# Patient Record
Sex: Male | Born: 1935 | Race: White | Hispanic: No | Marital: Married | Smoking: Former smoker
Health system: Southern US, Community
[De-identification: ages and names within clinical notes are randomized; demographics above are authoritative.]

## PROBLEM LIST (undated history)

## (undated) DIAGNOSIS — H9190 Unspecified hearing loss, unspecified ear: Secondary | ICD-10-CM

## (undated) DIAGNOSIS — I10 Essential (primary) hypertension: Secondary | ICD-10-CM

## (undated) DIAGNOSIS — I259 Chronic ischemic heart disease, unspecified: Secondary | ICD-10-CM

## (undated) DIAGNOSIS — I340 Nonrheumatic mitral (valve) insufficiency: Secondary | ICD-10-CM

## (undated) DIAGNOSIS — K219 Gastro-esophageal reflux disease without esophagitis: Secondary | ICD-10-CM

## (undated) DIAGNOSIS — E119 Type 2 diabetes mellitus without complications: Secondary | ICD-10-CM

## (undated) DIAGNOSIS — Z87442 Personal history of urinary calculi: Secondary | ICD-10-CM

## (undated) DIAGNOSIS — M1990 Unspecified osteoarthritis, unspecified site: Secondary | ICD-10-CM

## (undated) DIAGNOSIS — R197 Diarrhea, unspecified: Secondary | ICD-10-CM

## (undated) DIAGNOSIS — M109 Gout, unspecified: Secondary | ICD-10-CM

## (undated) HISTORY — DX: Essential (primary) hypertension: I10

## (undated) HISTORY — DX: Chronic ischemic heart disease, unspecified: I25.9

## (undated) HISTORY — DX: Gout, unspecified: M10.9

## (undated) HISTORY — PX: JOINT REPLACEMENT: SHX530

## (undated) HISTORY — PX: COLONOSCOPY: SHX174

## (undated) HISTORY — PX: EYE SURGERY: SHX253

## (undated) HISTORY — DX: Diarrhea, unspecified: R19.7

## (undated) HISTORY — DX: Type 2 diabetes mellitus without complications: E11.9

## (undated) HISTORY — DX: Nonrheumatic mitral (valve) insufficiency: I34.0

---
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
## 1959-05-06 HISTORY — PX: ANKLE SURGERY: SHX546

## 2012-02-26 ENCOUNTER — Encounter (HOSPITAL_COMMUNITY): Payer: Self-pay | Admitting: Pharmacy Technician

## 2012-03-03 NOTE — Patient Instructions (Addendum)
Your procedure is scheduled on: 03/11/2012  Report to [facility] at  0900     AM.  Call this number if you have problems the morning of surgery: [phone]   Do not eat food or drink liquids :After Midnight.      Take these medicines the morning of surgery with A SIP OF WATER:flomax,norvasc,lisinopril,metoprolol. Take flonase before you come. No glipizide morning of surgery.   Do not wear jewelry, make-up or nail polish.  Do not wear lotions, powders, or perfumes. You may wear deodorant.  Do not shave 48 hours prior to surgery.  Do not bring valuables to the hospital.  Contacts, dentures or bridgework may not be worn into surgery.  Leave suitcase in the car. After surgery it may be brought to your room.  For patients admitted to the hospital, checkout time is 11:00 AM the day of discharge.   Patients discharged the day of surgery will not be allowed to drive home.  :     Please read over the following fact sheets that you were given: Coughing and Deep Breathing, Surgical Site Infection Prevention, Anesthesia Post-op Instructions and Care and Recovery After Surgery    Cataract A cataract is a clouding of the lens of the eye. When a lens becomes cloudy, vision is reduced based on the degree and nature of the clouding. Many cataracts reduce vision to some degree. Some cataracts make people more near-sighted as they develop. Other cataracts increase glare. Cataracts that are ignored and become worse can sometimes look white. The white color can be seen through the pupil. CAUSES   Aging. However, cataracts may occur at any age, even in newborns.   Certain drugs.   Trauma to the eye.   Certain diseases such as diabetes.   Specific eye diseases such as chronic inflammation inside the eye or a sudden attack of a rare form of glaucoma.   Inherited or acquired medical problems.  SYMPTOMS   Gradual, progressive drop in vision in the affected eye.   Severe, rapid visual loss. This most  often happens when trauma is the cause.  DIAGNOSIS  To detect a cataract, an eye doctor examines the lens. Cataracts are best diagnosed with an exam of the eyes with the pupils enlarged (dilated) by drops.  TREATMENT  For an early cataract, vision may improve by using different eyeglasses or stronger lighting. If that does not help your vision, surgery is the only effective treatment. A cataract needs to be surgically removed when vision loss interferes with your everyday activities, such as driving, reading, or watching TV. A cataract may also have to be removed if it prevents examination or treatment of another eye problem. Surgery removes the cloudy lens and usually replaces it with a substitute lens (intraocular lens, IOL).  At a time when both you and your doctor agree, the cataract will be surgically removed. If you have cataracts in both eyes, only one is usually removed at a time. This allows the operated eye to heal and be out of danger from any possible problems after surgery (such as infection or poor wound healing). In rare cases, a cataract may be doing damage to your eye. In these cases, your caregiver may advise surgical removal right away. The vast majority of people who have cataract surgery have better vision afterward. HOME CARE INSTRUCTIONS  If you are not planning surgery, you may be asked to do the following:  Use different eyeglasses.   Use stronger or brighter lighting.  Ask your eye doctor about reducing your medicine dose or changing medicines if it is thought that a medicine caused your cataract. Changing medicines does not make the cataract go away on its own.   Become familiar with your surroundings. Poor vision can lead to injury. Avoid bumping into things on the affected side. You are at a higher risk for tripping or falling.   Exercise extreme care when driving or operating machinery.   Wear sunglasses if you are sensitive to bright light or experiencing problems  with glare.  SEEK IMMEDIATE MEDICAL CARE IF:   You have a worsening or sudden vision loss.   You notice redness, swelling, or increasing pain in the eye.   You have a fever.  Document Released: 04/21/2005 Document Revised: 04/10/2011 Document Reviewed: 12/13/2010 [facility] Patient Information 2012 [place].PATIENT INSTRUCTIONS POST-ANESTHESIA  IMMEDIATELY FOLLOWING SURGERY:  Do not drive or operate machinery for the first twenty four hours after surgery.  Do not make any important decisions for twenty four hours after surgery or while taking narcotic pain medications or sedatives.  If you develop intractable nausea and vomiting or a severe headache please notify your doctor immediately.  FOLLOW-UP:  Please make an appointment with your surgeon as instructed. You do not need to follow up with anesthesia unless specifically instructed to do so.  WOUND CARE INSTRUCTIONS (if applicable):  Keep a dry clean dressing on the anesthesia/puncture wound site if there is drainage.  Once the wound has quit draining you may leave it open to air.  Generally you should leave the bandage intact for twenty four hours unless there is drainage.  If the epidural site drains for more than 36-48 hours please call the anesthesia department.  QUESTIONS?:  Please feel free to call your physician or the hospital operator if you have any questions, and they will be happy to assist you.

## 2012-03-04 ENCOUNTER — Encounter (HOSPITAL_COMMUNITY): Payer: Self-pay

## 2012-03-04 ENCOUNTER — Other Ambulatory Visit: Payer: Self-pay

## 2012-03-04 ENCOUNTER — Encounter (HOSPITAL_COMMUNITY)
Admission: RE | Admit: 2012-03-04 | Discharge: 2012-03-04 | Payer: Medicare Other | Source: Ambulatory Visit | Attending: Ophthalmology | Admitting: Ophthalmology

## 2012-03-04 HISTORY — DX: Essential (primary) hypertension: I10

## 2012-03-04 LAB — BASIC METABOLIC PANEL
Chloride: 100 mEq/L (ref 96–112)
Creatinine, Ser: 0.54 mg/dL (ref 0.50–1.35)
GFR calc Af Amer: >90 mL/min (ref >90)
GFR calc non Af Amer: >90 mL/min (ref >90)
Potassium: 4.4 mEq/L (ref 3.5–5.1)

## 2012-03-04 LAB — HEMOGLOBIN AND HEMATOCRIT, BLOOD: Hemoglobin: 15.7 g/dL (ref 13.0–17.0)

## 2012-03-10 MED ORDER — CYCLOPENTOLATE-PHENYLEPHRINE 0.2-1 % OP SOLN
OPHTHALMIC | Status: AC
  Filled 2012-03-10: qty 2

## 2012-03-10 MED ORDER — LIDOCAINE HCL (PF) 1 % IJ SOLN
INTRAMUSCULAR | Status: AC
  Filled 2012-03-10: qty 2

## 2012-03-10 MED ORDER — NEOMYCIN-POLYMYXIN-DEXAMETH 3.5-10000-0.1 OP OINT
TOPICAL_OINTMENT | OPHTHALMIC | Status: AC
  Filled 2012-03-10: qty 3.5

## 2012-03-10 MED ORDER — PHENYLEPHRINE HCL 2.5 % OP SOLN
OPHTHALMIC | Status: AC
  Filled 2012-03-10: qty 2

## 2012-03-10 MED ORDER — LIDOCAINE HCL 3.5 % OP GEL
OPHTHALMIC | Status: AC
  Filled 2012-03-10: qty 5

## 2012-03-10 MED ORDER — TETRACAINE HCL 0.5 % OP SOLN
OPHTHALMIC | Status: AC
  Filled 2012-03-10: qty 2

## 2012-03-11 ENCOUNTER — Encounter (HOSPITAL_COMMUNITY)
Admission: RE | Disposition: A | Discharge status: Home / Self Care | Payer: Self-pay | Source: Ambulatory Visit | Attending: Ophthalmology

## 2012-03-11 ENCOUNTER — Encounter (HOSPITAL_COMMUNITY): Payer: Self-pay | Admitting: *Deleted

## 2012-03-11 ENCOUNTER — Encounter (HOSPITAL_COMMUNITY): Payer: Self-pay | Admitting: Anesthesiology

## 2012-03-11 ENCOUNTER — Ambulatory Visit (HOSPITAL_COMMUNITY)
Admission: RE | Admit: 2012-03-11 | Discharge: 2012-03-11 | Disposition: A | Discharge status: Home / Self Care | Payer: Medicare Other | Source: Ambulatory Visit | Attending: Ophthalmology | Admitting: Ophthalmology

## 2012-03-11 ENCOUNTER — Ambulatory Visit (HOSPITAL_COMMUNITY): Payer: Medicare Other | Admitting: Anesthesiology

## 2012-03-11 DIAGNOSIS — Z01812 Encounter for preprocedural laboratory examination: Secondary | ICD-10-CM | POA: Insufficient documentation

## 2012-03-11 DIAGNOSIS — E119 Type 2 diabetes mellitus without complications: Secondary | ICD-10-CM | POA: Insufficient documentation

## 2012-03-11 DIAGNOSIS — Z01810 Encounter for preprocedural cardiovascular examination: Secondary | ICD-10-CM | POA: Insufficient documentation

## 2012-03-11 DIAGNOSIS — I10 Essential (primary) hypertension: Secondary | ICD-10-CM | POA: Insufficient documentation

## 2012-03-11 DIAGNOSIS — H2510 Age-related nuclear cataract, unspecified eye: Secondary | ICD-10-CM | POA: Insufficient documentation

## 2012-03-11 HISTORY — PX: CATARACT EXTRACTION W/PHACO: SHX586

## 2012-03-11 SURGERY — PHACOEMULSIFICATION, CATARACT, WITH IOL INSERTION
Anesthesia: Monitor Anesthesia Care | Site: Eye | Laterality: Left | Wound class: Clean

## 2012-03-11 MED ORDER — TETRACAINE HCL 0.5 % OP SOLN
1.0000 [drp] | OPHTHALMIC | Status: AC
  Administered 2012-03-11 (×3): 1 [drp] via OPHTHALMIC

## 2012-03-11 MED ORDER — EPINEPHRINE HCL 1 MG/ML IJ SOLN
INTRAOCULAR | Status: DC | PRN
  Administered 2012-03-11: 10:00:00

## 2012-03-11 MED ORDER — MIDAZOLAM HCL 2 MG/2ML IJ SOLN
1.0000 mg | INTRAMUSCULAR | Status: DC | PRN
  Administered 2012-03-11: 2 mg via INTRAVENOUS

## 2012-03-11 MED ORDER — EPINEPHRINE HCL 1 MG/ML IJ SOLN
INTRAMUSCULAR | Status: AC
  Filled 2012-03-11: qty 1

## 2012-03-11 MED ORDER — LACTATED RINGERS IV SOLN
INTRAVENOUS | Status: DC
  Administered 2012-03-11: 1000 mL via INTRAVENOUS

## 2012-03-11 MED ORDER — CYCLOPENTOLATE-PHENYLEPHRINE 0.2-1 % OP SOLN
1.0000 [drp] | OPHTHALMIC | Status: AC
  Administered 2012-03-11 (×3): 1 [drp] via OPHTHALMIC

## 2012-03-11 MED ORDER — FENTANYL CITRATE 0.05 MG/ML IJ SOLN: 25.0000 ug | INTRAMUSCULAR | Status: DC | PRN

## 2012-03-11 MED ORDER — PHENYLEPHRINE HCL 2.5 % OP SOLN
1.0000 [drp] | OPHTHALMIC | Status: AC
  Administered 2012-03-11 (×3): 1 [drp] via OPHTHALMIC

## 2012-03-11 MED ORDER — BSS IO SOLN
INTRAOCULAR | Status: DC | PRN
  Administered 2012-03-11: 15 mL via INTRAOCULAR

## 2012-03-11 MED ORDER — LIDOCAINE HCL 3.5 % OP GEL
1.0000 "application " | Freq: Once | OPHTHALMIC | Status: AC
  Administered 2012-03-11: 1 via OPHTHALMIC

## 2012-03-11 MED ORDER — NEOMYCIN-POLYMYXIN-DEXAMETH 0.1 % OP OINT
TOPICAL_OINTMENT | OPHTHALMIC | Status: DC | PRN
  Administered 2012-03-11: 1 via OPHTHALMIC

## 2012-03-11 MED ORDER — LIDOCAINE HCL (PF) 1 % IJ SOLN
INTRAOCULAR | Status: DC | PRN
  Administered 2012-03-11: 10:00:00 via OPHTHALMIC

## 2012-03-11 MED ORDER — MIDAZOLAM HCL 2 MG/2ML IJ SOLN
INTRAMUSCULAR | Status: AC
  Filled 2012-03-11: qty 2

## 2012-03-11 MED ORDER — POVIDONE-IODINE 5 % OP SOLN
OPHTHALMIC | Status: DC | PRN
  Administered 2012-03-11: 1 via OPHTHALMIC

## 2012-03-11 MED ORDER — LIDOCAINE HCL (PF) 1 % IJ SOLN: INTRAMUSCULAR | Status: DC | PRN

## 2012-03-11 MED ORDER — LACTATED RINGERS IV SOLN
INTRAVENOUS | Status: DC | PRN
  Administered 2012-03-11: 10:00:00 via INTRAVENOUS
  Administered 2012-03-11: 1000 mL

## 2012-03-11 MED ORDER — PROVISC 10 MG/ML IO SOLN
INTRAOCULAR | Status: DC | PRN
  Administered 2012-03-11: 8.5 mg via INTRAOCULAR

## 2012-03-11 MED ORDER — ONDANSETRON HCL 4 MG/2ML IJ SOLN: 4.0000 mg | Freq: Once | INTRAMUSCULAR | Status: DC | PRN

## 2012-03-11 SURGICAL SUPPLY — 33 items
CAPSULAR TENSION RING-AMO (OPHTHALMIC RELATED) IMPLANT
CLOTH BEACON ORANGE TIMEOUT ST (SAFETY) ×2 IMPLANT
EYE SHIELD UNIVERSAL CLEAR (GAUZE/BANDAGES/DRESSINGS) ×2 IMPLANT
GLOVE BIO SURGEON STRL SZ 6.5 (GLOVE) IMPLANT
GLOVE BIOGEL PI IND STRL 6.5 (GLOVE) IMPLANT
GLOVE BIOGEL PI IND STRL 7.0 (GLOVE) ×1 IMPLANT
GLOVE BIOGEL PI IND STRL 7.5 (GLOVE) IMPLANT
GLOVE BIOGEL PI INDICATOR 6.5 (GLOVE)
GLOVE BIOGEL PI INDICATOR 7.0 (GLOVE) ×1
GLOVE BIOGEL PI INDICATOR 7.5 (GLOVE)
GLOVE ECLIPSE 6.5 STRL STRAW (GLOVE) IMPLANT
GLOVE ECLIPSE 7.0 STRL STRAW (GLOVE) IMPLANT
GLOVE ECLIPSE 7.5 STRL STRAW (GLOVE) IMPLANT
GLOVE EXAM NITRILE LRG STRL (GLOVE) IMPLANT
GLOVE EXAM NITRILE MD LF STRL (GLOVE) ×2 IMPLANT
GLOVE SKINSENSE NS SZ6.5 (GLOVE)
GLOVE SKINSENSE NS SZ7.0 (GLOVE)
GLOVE SKINSENSE STRL SZ6.5 (GLOVE) IMPLANT
GLOVE SKINSENSE STRL SZ7.0 (GLOVE) IMPLANT
KIT VITRECTOMY (OPHTHALMIC RELATED) IMPLANT
NEEDLE HYPO 18GX1.5 BLUNT FILL (NEEDLE) ×2 IMPLANT
PAD ARMBOARD 7.5X6 YLW CONV (MISCELLANEOUS) ×2 IMPLANT
PROC W NO LENS (INTRAOCULAR LENS)
PROC W SPEC LENS (INTRAOCULAR LENS)
PROCESS W NO LENS (INTRAOCULAR LENS) IMPLANT
PROCESS W SPEC LENS (INTRAOCULAR LENS) IMPLANT
RING MALYGIN (MISCELLANEOUS) IMPLANT
SIGHTPATH CAT PROC W REG LENS (Ophthalmic Related) ×2 IMPLANT
SYR TB 1ML LL NO SAFETY (SYRINGE) ×2 IMPLANT
TAPE SURG TRANSPORE 1 IN (GAUZE/BANDAGES/DRESSINGS) ×1 IMPLANT
TAPE SURGICAL TRANSPORE 1 IN (GAUZE/BANDAGES/DRESSINGS) ×1
VISCOELASTIC ADDITIONAL (OPHTHALMIC RELATED) IMPLANT
WATER STERILE IRR 250ML POUR (IV SOLUTION) ×2 IMPLANT

## 2012-03-11 NOTE — Anesthesia Postprocedure Evaluation (Signed)
  Anesthesia Post-op Note  Patient: Wesley Garrison  Procedure(s) Performed: Procedure(s) (LRB) with comments: CATARACT EXTRACTION PHACO AND INTRAOCULAR LENS PLACEMENT (IOC) (Left) - CDE 11.09  Patient Location: PACU  Anesthesia Type: MAC  Level of Consciousness: awake, alert , oriented and patient cooperative  Airway and Oxygen Therapy: Patient Spontanous Breathing room air  Post-op Pain: mild  Post-op Assessment: Post-op Vital signs reviewed, Patient's Cardiovascular Status Stable, Respiratory Function Stable, Patent Airway and No signs of Nausea or vomiting  Post-op Vital Signs: Reviewed and stable  Complications: No apparent anesthesia complications  

## 2012-03-11 NOTE — H&P (Signed)
I have reviewed the H&P, the patient was re-examined, and I have identified no interval changes in medical condition and plan of care since the history and physical of record  

## 2012-03-11 NOTE — Brief Op Note (Signed)
Pre-Op Dx: Cataract OS Post-Op Dx: Cataract OS Surgeon: Deija Buhrman Anesthesia: Topical with MAC Surgery: Cataract Extraction with Intraocular lens Implant OS Implant: B&L enVista Specimen: None Complications: None 

## 2012-03-11 NOTE — Anesthesia Preprocedure Evaluation (Signed)
Anesthesia Evaluation   Patient awake    Reviewed: Allergy & Precautions, H&P , NPO status , Patient's Chart, lab work & pertinent test results  Airway Mallampati: II TM Distance: >3 FB     Dental  (+) Edentulous Upper and Edentulous Lower   Pulmonary former smoker,  breath sounds clear to auscultation        Cardiovascular hypertension, Pt. on medications Rhythm:Regular Rate:Normal     Neuro/Psych    GI/Hepatic negative GI ROS,   Endo/Other  diabetes, Well Controlled, Type 2, Oral Hypoglycemic Agents  Renal/GU Renal disease (stones)     Musculoskeletal   Abdominal   Peds  Hematology   Anesthesia Other Findings   Reproductive/Obstetrics                           Anesthesia Physical Anesthesia Plan  ASA: III  Anesthesia Plan: MAC   Post-op Pain Management:    Induction: Intravenous  Airway Management Planned: Nasal Cannula  Additional Equipment:   Intra-op Plan:   Post-operative Plan:   Informed Consent: I have reviewed the patients History and Physical, chart, labs and discussed the procedure including the risks, benefits and alternatives for the proposed anesthesia with the patient or authorized representative who has indicated his/her understanding and acceptance.     Plan Discussed with:   Anesthesia Plan Comments:         Anesthesia Quick Evaluation

## 2012-03-11 NOTE — Preoperative (Signed)
Beta Blockers   Reason not to administer Beta Blockers:Not Applicable 

## 2012-03-11 NOTE — Transfer of Care (Signed)
  Anesthesia Post-op Note  Patient: Wesley Garrison  Procedure(s) Performed: Procedure(s) (LRB) with comments: CATARACT EXTRACTION PHACO AND INTRAOCULAR LENS PLACEMENT (IOC) (Left) - CDE 11.09  Patient Location: PACU  Anesthesia Type: MAC  Level of Consciousness: awake, alert , oriented and patient cooperative  Airway and Oxygen Therapy: Patient Spontanous Breathing room air  Post-op Pain: mild  Post-op Assessment: Post-op Vital signs reviewed, Patient's Cardiovascular Status Stable, Respiratory Function Stable, Patent Airway and No signs of Nausea or vomiting  Post-op Vital Signs: Reviewed and stable  Complications: No apparent anesthesia complications

## 2012-03-11 NOTE — Anesthesia Procedure Notes (Signed)
Procedure Name: MAC Date/Time: 03/11/2012 10:12 AM Performed by: Carolyne Littles, AMY L Pre-anesthesia Checklist: Patient identified, Patient being monitored, Emergency Drugs available, Timeout performed and Suction available Oxygen Delivery Method: Nasal cannula

## 2012-03-12 NOTE — Op Note (Signed)
NAMEKEYVIN, RISON              ACCOUNT NO.:  [id number]  MEDICAL RECORD NO.:  [id number]  LOCATION:  [facility]                          FACILITY:  [facility]  PHYSICIAN:  Susanne Greenhouse, MD       DATE OF BIRTH:  1925-02-21  DATE OF PROCEDURE:  03/11/2012 DATE OF DISCHARGE:  03/11/2012                              OPERATIVE REPORT   PREOPERATIVE DIAGNOSIS:  Nuclear cataract, left eye, diagnosis code 366.16.  POSTOPERATIVE DIAGNOSIS:  Nuclear cataract, left eye, diagnosis code 366.16.  SURGEON:  Susanne Greenhouse, MD  OPERATION PERFORMED:  Phacoemulsification with posterior chamber intraocular lens implantation, left eye.  ANESTHESIA:  Topical with IV sedation.  OPERATIVE SUMMARY:  In the preoperative area, dilating drops were placed into the left eye.  The patient was then brought into the operating room where he was placed under topical anesthesia and IV sedation.  The eye was then prepped and draped.  Beginning with a 75 blade, a paracentesis port was made at the surgeon's 2 o'clock position.  The anterior chamber was then filled with a 1% nonpreserved lidocaine solution with epinephrine.  This was followed by Viscoat to deepen the chamber.  A small fornix-based peritomy was performed superiorly.  Next, a single iris hook was placed through the limbus superiorly.  A 2.4-mm keratome blade was then used to make a clear corneal incision over the iris hook. A bent cystotome needle and Utrata forceps were used to create a continuous tear capsulotomy.  Hydrodissection was performed using balanced salt solution on a fine cannula.  The lens nucleus was then removed using phacoemulsification in a quadrant cracking technique.  The cortical material was then removed with irrigation and aspiration.  The capsular bag and anterior chamber were refilled with Provisc.  The wound was widened to approximately 3 mm and a posterior chamber intraocular lens was placed into the capsular bag without difficulty  using an [employer] lens injecting system.  A single 10-0 nylon suture was then used to close the incision as well as stromal hydration.  The Provisc was removed from the anterior chamber and capsular bag with irrigation and aspiration.  At this point, the wounds were tested for leak, which were negative.  The anterior chamber remained deep and stable.  The patient tolerated the procedure well.  There were no operative complications, and he awoke from topical anesthesia and IV sedation without problem.  No surgical specimens.  Prosthetic device used is a Designer, industrial/product lens, model MX60, power of 22.0, serial number is [id number].          ______________________________ Susanne Greenhouse, MD     KEH/MEDQ  D:  03/11/2012  T:  03/12/2012  Job:  [id number]

## 2012-03-15 ENCOUNTER — Encounter (HOSPITAL_COMMUNITY): Payer: Self-pay | Admitting: Ophthalmology

## 2012-03-16 LAB — GLUCOSE, CAPILLARY: Glucose-Capillary: 136 mg/dL — ABNORMAL HIGH (ref 70–99)

## 2012-07-06 ENCOUNTER — Encounter: Payer: Self-pay | Admitting: Cardiology

## 2012-07-15 ENCOUNTER — Other Ambulatory Visit: Payer: Self-pay | Admitting: *Deleted

## 2012-07-15 ENCOUNTER — Encounter: Payer: Self-pay | Admitting: Cardiovascular Disease

## 2012-07-15 ENCOUNTER — Encounter: Payer: Self-pay | Admitting: *Deleted

## 2012-07-15 ENCOUNTER — Ambulatory Visit (INDEPENDENT_AMBULATORY_CARE_PROVIDER_SITE_OTHER): Payer: Medicare Other | Admitting: Cardiovascular Disease

## 2012-07-15 VITALS — BP 129/64 | HR 55 | Ht 69.0 in | Wt 183.0 lb

## 2012-07-15 DIAGNOSIS — R0609 Other forms of dyspnea: Secondary | ICD-10-CM

## 2012-07-15 DIAGNOSIS — I2510 Atherosclerotic heart disease of native coronary artery without angina pectoris: Secondary | ICD-10-CM | POA: Insufficient documentation

## 2012-07-15 DIAGNOSIS — R0600 Dyspnea, unspecified: Secondary | ICD-10-CM | POA: Insufficient documentation

## 2012-07-15 NOTE — Assessment & Plan Note (Signed)
This could be angina equivalent. Given the patient's prolonged history of diabetes and evidence of coronary calcifications on CT scan, we have to evaluate him for possible obstructive coronary artery disease. Thus, I recommend further evaluation with a pharmacologic nuclear stress test. The patient is not able to exercise on a treadmill due to severe back pain and bilateral knee replacement.

## 2012-07-15 NOTE — Patient Instructions (Addendum)
Your physician has requested that you have a lexiscan myoview. For further information please visit [email or web address]. Please follow instruction sheet, as given.  Follow up as needed 

## 2012-07-15 NOTE — Progress Notes (Signed)
HPI  This is a pleasant 79 year old male who was referred by Dr. Sherril Croon for evaluation of an abnormal echocardiogram and CT of the chest which showed evidence of coronary calcifications. The patient is not aware of any previous cardiac history. He has prolonged history of type 2 diabetes and hypertension. His functional capacity is extremely limited due to previous knee arthritis and significant back discomfort. He had a recent echocardiogram done for dyspnea which showed normal LV systolic function, moderate mitral regurgitation, mild aortic regurgitation and mild pulmonary hypertension. He had CT of the chest done due to cough which showed atherosclerosis and calcifications of the RCA and LAD. He denies any chest discomfort. He does complain of significant exertional fatigue and dyspnea. No orthopnea, PND or syncope. He has chronic lower extremity edema.  No Known Allergies   Current Outpatient Prescriptions on File Prior to Visit  Medication Sig Dispense Refill  . amLODipine (NORVASC) 5 MG tablet Take 1 tab by mouth every morning & 1/2 tab every evening      . aspirin 325 MG tablet Take 325 mg by mouth daily.      Marland Kitchen glipiZIDE (GLUCOTROL) 5 MG tablet Take 2 tabs by mouth every morning & 1 tab in the evening      . lisinopril (PRINIVIL,ZESTRIL) 5 MG tablet Take 5 mg by mouth daily.      . metFORMIN (GLUCOPHAGE) 500 MG tablet Take 500 mg by mouth daily with breakfast.       . metoprolol (LOPRESSOR) 50 MG tablet Take 50 mg by mouth 2 (two) times daily.      . Tamsulosin HCl (FLOMAX) 0.4 MG CAPS Take 0.4 mg by mouth daily.        No current facility-administered medications on file prior to visit.     Past Medical History  Diagnosis Date  . Hypertension   . Kidney stones 2012  . Gout   . DM type 2 (diabetes mellitus, type 2)     for at least 10 years     Past Surgical History  Procedure Laterality Date  . Spinal cyst  1946  . Ankle surgery  1961  . Cataract extraction w/phaco   03/11/2012    Procedure: CATARACT EXTRACTION PHACO AND INTRAOCULAR LENS PLACEMENT (IOC);  Surgeon: Gemma Payor, MD;  Location: [facility];  Service: Ophthalmology;  Laterality: Left;  CDE 11.09  . Colonoscopy       Family History  Problem Relation Age of Onset  . Diabetes Mother      History   Social History  . Marital Status: Married    Spouse Name: N/A    Number of Children: N/A  . Years of Education: N/A   Occupational History  . Not on file.   Social History Main Topics  . Smoking status: Former Games developer  . Smokeless tobacco: Not on file     Comment: used to chew tobacco, age 11   . Alcohol Use: No  . Drug Use: No  . Sexually Active: Not on file   Other Topics Concern  . Not on file   Social History Narrative  . No narrative on file     ROS Constitutional: Negative for fever, chills, diaphoresis, activity change, appetite change and fatigue.  HENT: Negative for hearing loss, nosebleeds, congestion, sore throat, facial swelling, drooling, trouble swallowing, neck pain, voice change, sinus pressure and tinnitus.  Eyes: Negative for photophobia, pain, discharge and visual disturbance.  Respiratory: Negative for apnea, chest tightness and wheezing.  Cardiovascular: Negative for chest pain, palpitations and leg swelling.  Gastrointestinal: Negative for nausea, vomiting, abdominal pain, diarrhea, constipation, blood in stool and abdominal distention.  Genitourinary: Negative for dysuria, urgency, frequency, hematuria and decreased urine volume.  Skin: Negative for color change, pallor, rash andkeletal: Negative for myalgias, back pain, joint swelling, arthralgias and gait problem.  wound.  Neurological: Negative for dizziness, tremors, seizures, syncope, speech difficulty, weakness, light-headedness, numbness and headaches.  Psychiatric/Behavioral: Negative for suicidal ideas, hallucinations, behavioral problems and agitation. The patient is not nervous/anxious.      PHYSICAL EXAM   BP 129/64  Pulse 55  Ht 5\' 9"  (1.753 m)  Wt 183 lb (83.008 kg)  BMI 27.01 kg/m2  SpO2 98% Constitutional: He is oriented to person, place, and time. He appears well-developed and well-nourished. No distress.  HENT: No nasal discharge.  Head: Normocephalic and atraumatic.  Eyes: Pupils are equal and round. Right eye exhibits no discharge. Left eye exhibits no discharge.  Neck: Normal range of motion. Neck supple. No JVD present. No thyromegaly present.  Cardiovascular: Normal rate, regular rhythm, normal heart sounds and. Exam reveals no gallop and no friction rub. No murmur heard.  Pulmonary/Chest: Effort normal and breath sounds normal. No stridor. No respiratory distress. He has no wheezes. He has no rales. He exhibits no tenderness.  Abdominal: Soft. Bowel sounds are normal. He exhibits no distension. There is no tenderness. There is no rebound and no guarding.  Musculoskeletal: Normal range of motion. He exhibits no edema and no tenderness.  Neurological: He is alert and oriented to person, place, and time. Coordination normal.  Skin: Skin is warm and dry. No rash noted. He is not diaphoretic. No erythema. No pallor.  Psychiatric: He has a normal mood and affect. His behavior is normal. Judgment and thought content normal.  Femoral pulses are normal bilaterally. No abdominal bruits     EKG: Normal sinus rhythm with a PVC. Nonspecific ST changes.   ASSESSMENT AND PLAN

## 2012-07-15 NOTE — Assessment & Plan Note (Signed)
Recent CT scan showed evidence of coronary calcifications and atherosclerosis. He will be evaluated by a stress test as outlined above. Even if the stress test is normal, he would probably benefit from being on a statin given his history of diabetes with evidence of atherosclerosis.

## 2012-07-29 DIAGNOSIS — I2510 Atherosclerotic heart disease of native coronary artery without angina pectoris: Secondary | ICD-10-CM

## 2012-08-04 ENCOUNTER — Telehealth: Payer: Self-pay | Admitting: *Deleted

## 2012-08-04 MED ORDER — ATORVASTATIN CALCIUM 20 MG PO TABS: 20.0000 mg | ORAL_TABLET | Freq: Every evening | ORAL | Status: DC

## 2012-08-04 NOTE — Telephone Encounter (Signed)
Message copied by Lesle Chris on Wed Aug 04, 2012  4:13 PM ------      Message from: Lorine Bears A      Created: Tue Aug 03, 2012  4:45 PM       Mildly abnormal stress test but overall low risk. I recommend we try medications before considering a cardiac cath. Start Atorvastatin 20 mg daily.       Have him follow up with me this month. ------

## 2012-08-04 NOTE — Telephone Encounter (Signed)
Notes Recorded by Lesle Chris, LPN on 05/10/1094 at 4:13 PM Patient notified and verbalized understanding. New rx will be sent to [facility] at pt request. Follow up OV scheduled with Dr. Kirke Corin for 4/25.

## 2012-08-27 ENCOUNTER — Encounter: Payer: Self-pay | Admitting: Cardiovascular Disease

## 2012-08-27 ENCOUNTER — Ambulatory Visit (INDEPENDENT_AMBULATORY_CARE_PROVIDER_SITE_OTHER): Payer: Medicare Other | Admitting: Cardiovascular Disease

## 2012-08-27 VITALS — BP 133/72 | HR 55 | Ht 69.0 in | Wt 181.0 lb

## 2012-08-27 DIAGNOSIS — E785 Hyperlipidemia, unspecified: Secondary | ICD-10-CM

## 2012-08-27 DIAGNOSIS — I2510 Atherosclerotic heart disease of native coronary artery without angina pectoris: Secondary | ICD-10-CM

## 2012-08-27 DIAGNOSIS — I059 Rheumatic mitral valve disease, unspecified: Secondary | ICD-10-CM

## 2012-08-27 DIAGNOSIS — I340 Nonrheumatic mitral (valve) insufficiency: Secondary | ICD-10-CM

## 2012-08-27 NOTE — Patient Instructions (Addendum)
Check labs.  Continue same medications.  Follow up in 6 months.

## 2012-08-27 NOTE — Progress Notes (Signed)
HPI  This is a pleasant 79 year old male who is here today for a follow up visit. He has prolonged history of type 2 diabetes and hypertension. His functional capacity is extremely limited due to previous knee arthritis and significant back discomfort. He had a recent outside echocardiogram done for dyspnea which showed normal LV systolic function, moderate mitral regurgitation, mild aortic regurgitation and mild pulmonary hypertension. He had CT of the chest done due to cough which showed atherosclerosis and calcifications of the RCA and LAD. He denies any chest discomfort.  I referred him for a pharmacologic nuclear stress test which showed mild partially reversible anteroseptal and inferior defect with normal EF. No high risk features. I started him on Atorvastatin. No new symptoms.   No Known Allergies   Current Outpatient Prescriptions on File Prior to Visit  Medication Sig Dispense Refill  . amLODipine (NORVASC) 5 MG tablet Take 1 tab by mouth every morning & 1/2 tab every evening      . aspirin 325 MG tablet Take 325 mg by mouth daily.      Marland Kitchen atorvastatin (LIPITOR) 20 MG tablet Take 1 tablet (20 mg total) by mouth every evening.  30 tablet  6  . glipiZIDE (GLUCOTROL) 5 MG tablet Take 2 tabs by mouth every morning & 1 tab in the evening      . lisinopril (PRINIVIL,ZESTRIL) 5 MG tablet Take 5 mg by mouth daily.      . metFORMIN (GLUCOPHAGE) 500 MG tablet Take 500 mg by mouth daily with breakfast.       . metoprolol (LOPRESSOR) 50 MG tablet Take 50 mg by mouth 2 (two) times daily.      . Tamsulosin HCl (FLOMAX) 0.4 MG CAPS Take 0.4 mg by mouth daily.        No current facility-administered medications on file prior to visit.     Past Medical History  Diagnosis Date  . Hypertension   . Kidney stones 2012  . Gout   . DM type 2 (diabetes mellitus, type 2)     for at least 10 years     Past Surgical History  Procedure Laterality Date  . Spinal cyst  1946  . Ankle surgery   1961  . Cataract extraction w/phaco  03/11/2012    Procedure: CATARACT EXTRACTION PHACO AND INTRAOCULAR LENS PLACEMENT (IOC);  Surgeon: Gemma Payor, MD;  Location: [facility];  Service: Ophthalmology;  Laterality: Left;  CDE 11.09  . Colonoscopy       Family History  Problem Relation Age of Onset  . Diabetes Mother      History   Social History  . Marital Status: Married    Spouse Name: N/A    Number of Children: N/A  . Years of Education: N/A   Occupational History  . Not on file.   Social History Main Topics  . Smoking status: Former Games developer  . Smokeless tobacco: Not on file     Comment: used to chew tobacco, age 27   . Alcohol Use: No  . Drug Use: No  . Sexually Active: Not on file   Other Topics Concern  . Not on file   Social History Narrative  . No narrative on file       PHYSICAL EXAM   Ht 5\' 9"  (1.753 m)  Wt 181 lb (82.101 kg)  BMI 26.72 kg/m2 Constitutional: He is oriented to person, place, and time. He appears well-developed and well-nourished. No distress.  HENT: No nasal  discharge.  Head: Normocephalic and atraumatic.  Eyes: Pupils are equal and round. Right eye exhibits no discharge. Left eye exhibits no discharge.  Neck: Normal range of motion. Neck supple. No JVD present. No thyromegaly present.  Cardiovascular: Normal rate, regular rhythm, normal heart sounds and. Exam reveals no gallop and no friction rub. No murmur heard.  Pulmonary/Chest: Effort normal and breath sounds normal. No stridor. No respiratory distress. He has no wheezes. He has no rales. He exhibits no tenderness.  Abdominal: Soft. Bowel sounds are normal. He exhibits no distension. There is no tenderness. There is no rebound and no guarding.  Musculoskeletal: Normal range of motion. He exhibits no edema and no tenderness.  Neurological: He is alert and oriented to person, place, and time. Coordination normal.  Skin: Skin is warm and dry. No rash noted. He is not diaphoretic. No  erythema. No pallor.  Psychiatric: He has a normal mood and affect. His behavior is normal. Judgment and thought content normal.  Femoral pulses are normal bilaterally. No abdominal bruits    ASSESSMENT AND PLAN

## 2012-08-27 NOTE — Assessment & Plan Note (Signed)
Mildly abnormal stress test without high risk features and evidence of coronary calcifications on CT scan. Due to his age, poor functional capacity and no convincing symptoms of angina, I think medical therapy is the best option in his situation.  Atorvastatin was added.  Continue other cardiac medications.  Will Check fasting lipid and liver profile.

## 2012-08-27 NOTE — Assessment & Plan Note (Signed)
This was moderate. Future echocardiogram based on symptoms.

## 2012-09-02 ENCOUNTER — Telehealth: Payer: Self-pay | Admitting: *Deleted

## 2012-09-02 NOTE — Telephone Encounter (Signed)
Message copied by Lesle Chris on Thu Sep 02, 2012 11:07 AM ------      Message from: Lorine Bears A      Created: Wed Sep 01, 2012  9:56 AM       Inform patient that labs were normal. Cholesterol was very good but low. Decrease Atorvastatin to 10 mg daily. ------

## 2012-09-02 NOTE — Telephone Encounter (Signed)
Notes Recorded by Lesle Chris, LPN on 05/10/1094 at 11:07 AM Patient notified.

## 2013-02-02 ENCOUNTER — Encounter (INDEPENDENT_AMBULATORY_CARE_PROVIDER_SITE_OTHER): Payer: Self-pay | Admitting: *Deleted

## 2013-02-16 ENCOUNTER — Ambulatory Visit: Payer: Medicare Other | Admitting: Cardiovascular Disease

## 2013-02-17 ENCOUNTER — Encounter: Payer: Self-pay | Admitting: Cardiovascular Disease

## 2013-02-17 ENCOUNTER — Ambulatory Visit (INDEPENDENT_AMBULATORY_CARE_PROVIDER_SITE_OTHER): Payer: Medicare Other | Admitting: Cardiovascular Disease

## 2013-02-17 VITALS — BP 128/69 | HR 56 | Ht 69.0 in | Wt 186.0 lb

## 2013-02-17 DIAGNOSIS — I10 Essential (primary) hypertension: Secondary | ICD-10-CM

## 2013-02-17 DIAGNOSIS — I2510 Atherosclerotic heart disease of native coronary artery without angina pectoris: Secondary | ICD-10-CM

## 2013-02-17 DIAGNOSIS — E785 Hyperlipidemia, unspecified: Secondary | ICD-10-CM

## 2013-02-17 DIAGNOSIS — I340 Nonrheumatic mitral (valve) insufficiency: Secondary | ICD-10-CM

## 2013-02-17 DIAGNOSIS — I059 Rheumatic mitral valve disease, unspecified: Secondary | ICD-10-CM

## 2013-02-17 MED ORDER — ATORVASTATIN CALCIUM 10 MG PO TABS: 10.0000 mg | ORAL_TABLET | Freq: Every evening | ORAL | Status: DC

## 2013-02-17 MED ORDER — ASPIRIN EC 81 MG PO TBEC: 81.0000 mg | DELAYED_RELEASE_TABLET | Freq: Every day | ORAL | Status: DC

## 2013-02-17 NOTE — Patient Instructions (Signed)
   Decrease Aspirin to 81mg  daily  90 day supply + 3 refills sent to [facility] on Lipitor 10mg  every evening Continue all other current medications. Your physician wants you to follow up in: 6 months.  You will receive a reminder letter in the mail one-two months in advance.  If you don't receive a letter, please call our office to schedule the follow up appointment

## 2013-02-17 NOTE — Progress Notes (Signed)
Patient ID: Wesley Garrison, male   DOB: [birth year], 79 y.o.   MRN: [id number]      SUBJECTIVE: Wesley Garrison is a pleasant 79 year old male who is here today for a follow up visit for his CAD and mitral regurgitation. He has a prolonged history of type 2 diabetes and hypertension. His functional capacity is extremely limited due to previous knee arthritis and significant back discomfort. He had an echocardiogram in the past done for dyspnea which showed normal LV systolic function, moderate mitral regurgitation, mild aortic regurgitation and mild pulmonary hypertension. He had a CT of the chest done due for cough which showed atherosclerosis and calcifications of the RCA and LAD.  He denies any chest discomfort. He's had a pharmacologic nuclear stress test in the past which showed mild partially reversible anteroseptal and inferior defect with normal EF, with no high risk features.  He is here with his wife, Wesley Garrison, who is also going to be my patient.  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    No Known Allergies  Current Outpatient Prescriptions  Medication Sig Dispense Refill  . amLODipine (NORVASC) 5 MG tablet Take 1 tab by mouth every morning & 1/2 tab every evening      . aspirin 325 MG tablet Take 325 mg by mouth daily.      Marland Kitchen atorvastatin (LIPITOR) 20 MG tablet Take 10 mg by mouth every evening. Dose decreased 09/02/2012 based off recent cholesterol labs.      . furosemide (LASIX) 20 MG tablet Take 20 mg by mouth 2 (two) times daily.      Marland Kitchen glipiZIDE (GLUCOTROL) 5 MG tablet Take 2 tabs by mouth every morning & 1 tab in the evening      . lisinopril (PRINIVIL,ZESTRIL) 5 MG tablet Take 5 mg by mouth daily.      . metFORMIN (GLUCOPHAGE) 500 MG tablet Take 500 mg by mouth daily with breakfast.       . metoprolol (LOPRESSOR) 50 MG tablet Take 50 mg by mouth 2 (two) times daily.      . potassium  chloride (K-DUR) 10 MEQ tablet Take 10 mEq by mouth as needed.      . Tamsulosin HCl (FLOMAX) 0.4 MG CAPS Take 0.4 mg by mouth daily.        No current facility-administered medications for this visit.    Past Medical History  Diagnosis Date  . Hypertension   . Kidney stones 2012  . Gout   . DM type 2 (diabetes mellitus, type 2)     for at least 10 years    Past Surgical History  Procedure Laterality Date  . Spinal cyst  1946  . Ankle surgery  1961  . Cataract extraction w/phaco  03/11/2012    Procedure: CATARACT EXTRACTION PHACO AND INTRAOCULAR LENS PLACEMENT (IOC);  Surgeon: Gemma Payor, MD;  Location: [facility];  Service: Ophthalmology;  Laterality: Left;  CDE 11.09  . Colonoscopy      History   Social History  . Marital Status: Married    Spouse Name: N/A    Number of Children: N/A  . Years of Education: N/A   Occupational History  . Not on file.   Social History Main Topics  . Smoking status: Former Smoker -- 2 years    Types: Cigarettes    Quit date: 05/05/1948  . Smokeless tobacco: Former Neurosurgeon  Types: Dorna Bloom    Quit date: 05/05/2002     Comment: used to chew tobacco, age 26   . Alcohol Use: No  . Drug Use: No  . Sexual Activity: Not on file   Other Topics Concern  . Not on file   Social History Narrative  . No narrative on file     Filed Vitals:   02/17/13 1012  Height: 5\' 9"  (1.753 m)  Weight: 186 lb (84.369 kg)    PHYSICAL EXAM General: NAD Neck: No JVD, no thyromegaly or thyroid nodule.  Lungs: Clear to auscultation bilaterally with normal respiratory effort. CV: Nondisplaced PMI.  Heart regular S1/S2, no S3/S4, no murmur.  No peripheral edema.  No carotid bruit.  Normal pedal pulses.  Abdomen: Soft, nontender, no hepatosplenomegaly, no distention.  Neurologic: Alert and oriented x 3.  Psych: Normal affect. Extremities: No clubbing or cyanosis.   ECG: reviewed and available in electronic records.      ASSESSMENT AND PLAN: 1. CAD: he  takes ASA 325 mg daily, and I told him to reduce this to 81 mg daily. He is symptomatically stable. Continue Lipitor. Again, he had a mildly abnormal stress test without high risk features and evidence of coronary calcifications on CT scan. Due to his age, poor functional capacity and no convincing symptoms of angina, I think continued medical therapy is the best option in his situation. 2. Mitral regurgitation: moderate when last checked and asymptomatic from this standpoint.  3. HTN: controlled on current therapy which includes lisinopril. 4. Hyperlipidemia: continue Lipitor.   Prentice Docker, M.D., F.A.C.C.

## 2013-02-21 ENCOUNTER — Encounter (INDEPENDENT_AMBULATORY_CARE_PROVIDER_SITE_OTHER): Payer: Self-pay | Admitting: Internal Medicine

## 2013-02-21 ENCOUNTER — Telehealth (INDEPENDENT_AMBULATORY_CARE_PROVIDER_SITE_OTHER): Payer: Self-pay | Admitting: *Deleted

## 2013-02-21 ENCOUNTER — Other Ambulatory Visit (INDEPENDENT_AMBULATORY_CARE_PROVIDER_SITE_OTHER): Payer: Self-pay | Admitting: *Deleted

## 2013-02-21 ENCOUNTER — Ambulatory Visit (INDEPENDENT_AMBULATORY_CARE_PROVIDER_SITE_OTHER): Payer: Medicare Other | Admitting: Internal Medicine

## 2013-02-21 VITALS — BP 118/50 | HR 60 | Temp 98.0°F | Ht 69.0 in | Wt 184.5 lb

## 2013-02-21 DIAGNOSIS — M109 Gout, unspecified: Secondary | ICD-10-CM | POA: Insufficient documentation

## 2013-02-21 DIAGNOSIS — Z1211 Encounter for screening for malignant neoplasm of colon: Secondary | ICD-10-CM

## 2013-02-21 DIAGNOSIS — E7800 Pure hypercholesterolemia, unspecified: Secondary | ICD-10-CM

## 2013-02-21 DIAGNOSIS — R195 Other fecal abnormalities: Secondary | ICD-10-CM | POA: Insufficient documentation

## 2013-02-21 DIAGNOSIS — E119 Type 2 diabetes mellitus without complications: Secondary | ICD-10-CM | POA: Insufficient documentation

## 2013-02-21 MED ORDER — PEG-KCL-NACL-NASULF-NA ASC-C 100 G PO SOLR: 1.0000 | Freq: Once | ORAL | Status: DC

## 2013-02-21 NOTE — Patient Instructions (Signed)
Colonoscopy with Dr. Rehman 

## 2013-02-21 NOTE — Telephone Encounter (Signed)
Patient needs movi prep 

## 2013-02-21 NOTE — Progress Notes (Signed)
Subjective:     Patient ID: Wesley Garrison, male   DOB: [birth year], 79 y.o.   MRN: [id number]  HPI Referred to our office by [facility], Dr. Sherril Croon. for positive stool card. He denies prior hx of having blood. Appetite is good. No weight loss. He denies any abdominal pain. No dysphagia.  He usually has a BM daily. His stools are brown and sometimes they are black. Stools are black if he eats chocolate cookies.    Colonoscopy 2007 Dr. Gabriel Cirri was normal. (1st one). Screening purposes.     01/14/2013  H and H 14.6 and 45.1, MCV 89   Review of Systems see hpi Current Outpatient Prescriptions  Medication Sig Dispense Refill  . amLODipine (NORVASC) 2.5 MG tablet Take 5 mg by mouth every morning. & 2.5 mg tablet in the evening      . aspirin EC 81 MG tablet Take 325 mg by mouth daily.      Marland Kitchen atorvastatin (LIPITOR) 10 MG tablet Take 20 mg by mouth every evening.      . celecoxib (CELEBREX) 200 MG capsule Take 200 mg by mouth daily.      . fluticasone (FLONASE) 50 MCG/ACT nasal spray Place 2 sprays into the nose daily.      Marland Kitchen glipiZIDE (GLUCOTROL) 5 MG tablet Take 2 tabs by mouth every morning & 1 tab in the evening      . lisinopril (PRINIVIL,ZESTRIL) 5 MG tablet Take 5 mg by mouth daily.      . metFORMIN (GLUCOPHAGE) 500 MG tablet Take 500 mg by mouth daily with breakfast.       . metoprolol (LOPRESSOR) 50 MG tablet Take 50 mg by mouth daily.       Marland Kitchen oxyCODONE-acetaminophen (PERCOCET/ROXICET) 5-325 MG per tablet Take 1 tablet by mouth every 8 (eight) hours as needed for pain.      . potassium chloride (K-DUR) 10 MEQ tablet Take 10 mEq by mouth as needed.      . Tamsulosin HCl (FLOMAX) 0.4 MG CAPS Take 0.4 mg by mouth daily.       . furosemide (LASIX) 20 MG tablet Take 20 mg by mouth daily as needed.        No current facility-administered medications for this visit.   Past Medical History  Diagnosis Date  . Hypertension   . Kidney stones 2012  . Gout   . DM type 2  (diabetes mellitus, type 2)     for at least 10 years   Past Surgical History  Procedure Laterality Date  . Spinal cyst  1946  . Ankle surgery  1961  . Cataract extraction w/phaco  03/11/2012    Procedure: CATARACT EXTRACTION PHACO AND INTRAOCULAR LENS PLACEMENT (IOC);  Surgeon: Gemma Payor, MD;  Location: [facility];  Service: Ophthalmology;  Laterality: Left;  CDE 11.09  . Colonoscopy     No Known Allergies       Objective:   Physical Exam  Filed Vitals:   02/21/13 1525  BP: 118/50  Pulse: 60  Temp: 98 F (36.7 C)  Height: 5\' 9"  (1.753 m)  Weight: 184 lb 8 oz (83.689 kg)    Alert and oriented. Skin warm and dry. Oral mucosa is moist.   . Sclera anicteric, conjunctivae is pink. Thyroid not enlarged. No cervical lymphadenopathy. Lungs clear. Heart regular rate and rhythm.  Abdomen is soft. Bowel sounds are positive. No hepatomegaly. No abdominal masses felt. No tenderness.  No edema to lower extremities. Stool  brown and guaiac negative     Assessment:     Hx of hem positive stools. Colonic neoplasm needs to be ruled out. AVM, polyp also in the differential.    Plan:    Patient would like to proceed with a colonoscopy.    The risks and benefits such as perforation, bleeding, and infection were reviewed with the patient and is agreeable.

## 2013-03-01 ENCOUNTER — Encounter (HOSPITAL_COMMUNITY)
Admission: RE | Disposition: A | Discharge status: Home / Self Care | Payer: Self-pay | Source: Ambulatory Visit | Attending: Internal Medicine

## 2013-03-01 ENCOUNTER — Ambulatory Visit (HOSPITAL_COMMUNITY)
Admission: RE | Admit: 2013-03-01 | Discharge: 2013-03-01 | Disposition: A | Discharge status: Home / Self Care | Payer: Medicare Other | Source: Ambulatory Visit | Attending: Internal Medicine | Admitting: Internal Medicine

## 2013-03-01 ENCOUNTER — Encounter (HOSPITAL_COMMUNITY): Payer: Self-pay

## 2013-03-01 DIAGNOSIS — I10 Essential (primary) hypertension: Secondary | ICD-10-CM | POA: Insufficient documentation

## 2013-03-01 DIAGNOSIS — K5730 Diverticulosis of large intestine without perforation or abscess without bleeding: Secondary | ICD-10-CM | POA: Insufficient documentation

## 2013-03-01 DIAGNOSIS — R195 Other fecal abnormalities: Secondary | ICD-10-CM

## 2013-03-01 DIAGNOSIS — E119 Type 2 diabetes mellitus without complications: Secondary | ICD-10-CM | POA: Insufficient documentation

## 2013-03-01 DIAGNOSIS — K644 Residual hemorrhoidal skin tags: Secondary | ICD-10-CM

## 2013-03-01 DIAGNOSIS — Z01812 Encounter for preprocedural laboratory examination: Secondary | ICD-10-CM | POA: Insufficient documentation

## 2013-03-01 DIAGNOSIS — K648 Other hemorrhoids: Secondary | ICD-10-CM

## 2013-03-01 DIAGNOSIS — K921 Melena: Secondary | ICD-10-CM | POA: Insufficient documentation

## 2013-03-01 HISTORY — PX: COLONOSCOPY: SHX5424

## 2013-03-01 HISTORY — DX: Personal history of urinary calculi: Z87.442

## 2013-03-01 HISTORY — DX: Unspecified hearing loss, unspecified ear: H91.90

## 2013-03-01 HISTORY — DX: Unspecified osteoarthritis, unspecified site: M19.90

## 2013-03-01 LAB — GLUCOSE, CAPILLARY: Glucose-Capillary: 153 mg/dL — ABNORMAL HIGH (ref 70–99)

## 2013-03-01 SURGERY — COLONOSCOPY
Anesthesia: Moderate Sedation

## 2013-03-01 MED ORDER — MIDAZOLAM HCL 5 MG/5ML IJ SOLN
INTRAMUSCULAR | Status: DC | PRN
  Administered 2013-03-01: 2 mg via INTRAVENOUS
  Administered 2013-03-01: 1 mg via INTRAVENOUS

## 2013-03-01 MED ORDER — SODIUM CHLORIDE 0.9 % IV SOLN
INTRAVENOUS | Status: DC
  Administered 2013-03-01: 07:00:00 via INTRAVENOUS

## 2013-03-01 MED ORDER — MEPERIDINE HCL 50 MG/ML IJ SOLN
INTRAMUSCULAR | Status: AC
  Filled 2013-03-01: qty 1

## 2013-03-01 MED ORDER — PANTOPRAZOLE SODIUM 40 MG PO TBEC: 40.0000 mg | DELAYED_RELEASE_TABLET | Freq: Every day | ORAL | Status: DC

## 2013-03-01 MED ORDER — MEPERIDINE HCL 50 MG/ML IJ SOLN
INTRAMUSCULAR | Status: DC | PRN
  Administered 2013-03-01: 25 mg via INTRAVENOUS

## 2013-03-01 MED ORDER — STERILE WATER FOR IRRIGATION IR SOLN
Status: DC | PRN
  Administered 2013-03-01: 08:00:00

## 2013-03-01 MED ORDER — SIMETHICONE 40 MG/0.6ML PO SUSP
ORAL | Status: AC
  Filled 2013-03-01: qty 1.2

## 2013-03-01 MED ORDER — MIDAZOLAM HCL 5 MG/5ML IJ SOLN
INTRAMUSCULAR | Status: AC
  Filled 2013-03-01: qty 10

## 2013-03-01 NOTE — Op Note (Signed)
COLONOSCOPY PROCEDURE REPORT  PATIENT:  Wesley Garrison  MR#:  [id number] Birthdate:  [birth year], 79 y.o., male Endoscopist:  Dr. Malissa Hippo, MD Referred By:  Dr. Ignatius Specking, MD Procedure Date: 03/01/2013  Procedure:   Colonoscopy  Indications:  Patient is a [age]-year-old Caucasian male who was found to have heme positive stool. He is undergoing diagnostic colonoscopy. His last exam was in 2007 at [facility] in [facility]. Patient has no GI symptoms. He is on low-dose aspirin. Celecoxib was added recently. Family history is negative for CRC.   Informed Consent:  The procedure and risks were reviewed with the patient and informed consent was obtained.  Medications:  Demerol 25 mg IV Versed 3 mg IV  Description of procedure:  After a digital rectal exam was performed, that colonoscope was advanced from the anus through the rectum and colon to the area of the cecum, ileocecal valve and appendiceal orifice. The cecum was deeply intubated. These structures were well-seen and photographed for the record. From the level of the cecum and ileocecal valve, the scope was slowly and cautiously withdrawn. The mucosal surfaces were carefully surveyed utilizing scope tip to flexion to facilitate fold flattening as needed. The scope was pulled down into the rectum where a thorough exam including retroflexion was performed.  Findings:   Prep excellent. Moderate number of diverticula noted at sigmoid colon. Normal rectal mucosa. Hemorrhoids noted above and below the dentate line.   Therapeutic/Diagnostic Maneuvers Performed:   None  Complications:  None  Cecal Withdrawal Time:  10 minutes  Impression:  Examination performed to cecum. Moderate sigmoid colon diverticulosis. Internal/external hemorrhoids. No evidence of polyps masses or AV malformations.  Recommendations:  Standard instructions given. Patient should have H&H repeated in 3 months. Pantoprazole 40 mg by mouth every  morning to reduce the risk of PUD since patient is on aspirin and  another NSAID. Consider dropping celecoxib dose 200 mg daily if possible.  Starleen Trussell U  03/01/2013 8:01 AM  CC: Dr. Ignatius Specking., MD & Dr. Bonnetta Barry ref. provider found

## 2013-03-01 NOTE — H&P (Signed)
Wesley Garrison is an 79 y.o. male.   Chief Complaint: Patient is here for colonoscopy. HPI: Patient is an 79 year old Caucasian male who was recently found to have heme-positive stools. He denies abdominal pain change in his bowel habits or rectal bleeding. He is on an aspirin daily. He said Celebrex was just begun recently after stool was noted to be heme-positive. Patient's last colonoscopy was in 2007 and was reportedly normal. Asians H&H was normal on 01/14/2013(14.6 and 45.1). Family history is negative for CRC.  Past Medical History  Diagnosis Date  . Hypertension   . Gout   . DM type 2 (diabetes mellitus, type 2)     for at least 10 years  . History of kidney stones   . Arthritis   . HOH (hard of hearing)     Past Surgical History  Procedure Laterality Date  . Spinal cyst  1946  . Ankle surgery Right 1961    removal of ankle bone  . Cataract extraction w/phaco  03/11/2012    Procedure: CATARACT EXTRACTION PHACO AND INTRAOCULAR LENS PLACEMENT (IOC);  Surgeon: Gemma Payor, MD;  Location: [facility];  Service: Ophthalmology;  Laterality: Left;  CDE 11.09  . Colonoscopy      Family History  Problem Relation Age of Onset  . Diabetes Mother    Social History:  reports that he quit smoking about 64 years ago. His smoking use included Cigarettes. He smoked 0.00 packs per day for 2 years. He quit smokeless tobacco use about 10 years ago. His smokeless tobacco use included Chew. He reports that he does not drink alcohol or use illicit drugs.  Allergies: No Known Allergies  Medications Prior to Admission  Medication Sig Dispense Refill  . amLODipine (NORVASC) 2.5 MG tablet Take 5 mg by mouth daily.       Marland Kitchen aspirin EC 81 MG tablet Take 81 mg by mouth daily.       Marland Kitchen atorvastatin (LIPITOR) 10 MG tablet Take 10 mg by mouth every evening.       . celecoxib (CELEBREX) 200 MG capsule Take 200 mg by mouth daily.      . fluticasone (FLONASE) 50 MCG/ACT nasal spray Place 2 sprays into the nose  daily.      . furosemide (LASIX) 20 MG tablet Take 20 mg by mouth daily as needed for fluid.       Marland Kitchen glipiZIDE (GLUCOTROL) 5 MG tablet Take 5-10 mg by mouth 2 (two) times daily before a meal. Take 2 tabs by mouth every morning & 1 tab in the evening      . lisinopril (PRINIVIL,ZESTRIL) 5 MG tablet Take 5 mg by mouth daily.      . metFORMIN (GLUCOPHAGE) 500 MG tablet Take 500 mg by mouth daily with breakfast.       . metoprolol (LOPRESSOR) 50 MG tablet Take 50 mg by mouth 2 (two) times daily.       Marland Kitchen oxyCODONE-acetaminophen (PERCOCET/ROXICET) 5-325 MG per tablet Take 1 tablet by mouth every 8 (eight) hours as needed for pain.      . peg 3350 powder (MOVIPREP) 100 G SOLR Take 1 kit (200 g total) by mouth once.  1 kit  0  . potassium chloride (K-DUR) 10 MEQ tablet Take 10 mEq by mouth as needed (only takes with furosemide).       . Tamsulosin HCl (FLOMAX) 0.4 MG CAPS Take 0.4 mg by mouth daily.         Results for  orders placed during the hospital encounter of 03/01/13 (from the past 48 hour(s))  GLUCOSE, CAPILLARY     Status: Abnormal   Collection Time    03/01/13  7:19 AM      Result Value Range   Glucose-Capillary 153 (*) 70 - 99 mg/dL   No results found.  ROS  Blood pressure 169/89, pulse 74, temperature 97.5 F (36.4 C), temperature source Oral, resp. rate 15, height 5\' 9"  (1.753 m), weight 184 lb (83.462 kg), SpO2 99.00%. Physical Exam  Constitutional: He appears well-developed and well-nourished.  HENT:  Mouth/Throat: Oropharynx is clear and moist.  Eyes: Conjunctivae are normal. No scleral icterus.  Neck: No thyromegaly present.  Cardiovascular: Normal rate, regular rhythm and normal heart sounds.   No murmur heard. Respiratory: Effort normal and breath sounds normal.  GI: Soft. He exhibits no distension and no mass. There is no tenderness.  Musculoskeletal: He exhibits no edema.  Lymphadenopathy:    He has no cervical adenopathy.  Neurological: He is alert.  Skin: Skin is  warm and dry.     Assessment/Plan Heme-positive stools. Diagnostic colonoscopy.  Nyilah Kight U 03/01/2013, 7:31 AM

## 2013-03-02 ENCOUNTER — Telehealth: Payer: Self-pay | Admitting: Cardiovascular Disease

## 2013-03-02 NOTE — Telephone Encounter (Signed)
Patient states that he wanted to speak with Dr.Koneswaran. Patient would not tell me what he wanted to speak with him about only that he was his patient and wanted to speak with him. / tgs

## 2013-03-02 NOTE — Telephone Encounter (Signed)
Called pt and wanted to know what the pt needed to speak to the Dr about. Pt replied he wanted Dr Purvis Sheffield to call him Please advise

## 2013-03-03 ENCOUNTER — Encounter: Payer: Self-pay | Admitting: *Deleted

## 2013-03-03 NOTE — Telephone Encounter (Signed)
I spoke with patient yesterday, and he just wanted to call and let me know he was doing fine with respect to his health.

## 2013-03-04 ENCOUNTER — Encounter (HOSPITAL_COMMUNITY): Payer: Self-pay | Admitting: Internal Medicine

## 2013-03-10 ENCOUNTER — Other Ambulatory Visit: Payer: Self-pay

## 2013-03-10 ENCOUNTER — Encounter (INDEPENDENT_AMBULATORY_CARE_PROVIDER_SITE_OTHER): Payer: Self-pay

## 2013-08-10 ENCOUNTER — Encounter: Payer: Self-pay | Admitting: Cardiovascular Disease

## 2013-08-10 ENCOUNTER — Ambulatory Visit (INDEPENDENT_AMBULATORY_CARE_PROVIDER_SITE_OTHER): Payer: Medicare Other | Admitting: Cardiovascular Disease

## 2013-08-10 VITALS — BP 133/69 | HR 56 | Ht 69.0 in | Wt 177.0 lb

## 2013-08-10 DIAGNOSIS — I340 Nonrheumatic mitral (valve) insufficiency: Secondary | ICD-10-CM

## 2013-08-10 DIAGNOSIS — I059 Rheumatic mitral valve disease, unspecified: Secondary | ICD-10-CM

## 2013-08-10 DIAGNOSIS — I10 Essential (primary) hypertension: Secondary | ICD-10-CM

## 2013-08-10 DIAGNOSIS — E785 Hyperlipidemia, unspecified: Secondary | ICD-10-CM

## 2013-08-10 DIAGNOSIS — I2510 Atherosclerotic heart disease of native coronary artery without angina pectoris: Secondary | ICD-10-CM

## 2013-08-10 NOTE — Patient Instructions (Signed)
   Stop Amlodipine Continue all other medications.   Your physician wants you to follow up in: 6 months.  You will receive a reminder letter in the mail one-two months in advance.  If you don't receive a letter, please call our office to schedule the follow up appointment

## 2013-08-10 NOTE — Progress Notes (Signed)
Patient ID: Wesley Garrison, male   DOB: 1924-12-01, 80 y.o.   MRN: [id number]      SUBJECTIVE: Wesley Garrison is an 80 year old male who is here for routine f/u for his CAD and mitral regurgitation. He has a history of type 2 diabetes and hypertension. His functional capacity is extremely limited due to previous knee arthritis and significant back discomfort. He had an echocardiogram in the past done for dyspnea which showed normal LV systolic function, moderate mitral regurgitation, mild aortic regurgitation and mild pulmonary hypertension. He had a CT of the chest done due for cough which showed atherosclerosis and calcifications of the RCA and LAD.  He previously had a pharmacologic nuclear stress test in the past which showed mild partially reversible anteroseptal and inferior defect with normal EF, with no high risk features.   ECG today shows sinus bradycardia, heart rate 56 beats per minute, and a very mild nonspecific T wave abnormality primarily in the inferior leads.  The patient denies any symptoms of chest pain, palpitations, shortness of breath, lightheadedness, dizziness, leg swelling, orthopnea, PND, and syncope. His only problem is arthritis of the right hip, and is going to see an orthopedic surgeon on 10/07/13 for a second opinion.      No Known Allergies  Current Outpatient Prescriptions  Medication Sig Dispense Refill  . amLODipine (NORVASC) 2.5 MG tablet Take 2.5 mg by mouth every morning. & 1 by mouth in the evening      . aspirin EC 81 MG tablet Take 81 mg by mouth daily.       Marland Kitchen atorvastatin (LIPITOR) 10 MG tablet Take 10 mg by mouth every evening.       . fluticasone (FLONASE) 50 MCG/ACT nasal spray Place 2 sprays into the nose daily.      . furosemide (LASIX) 20 MG tablet Take 20 mg by mouth daily as needed for fluid.       Marland Kitchen glipiZIDE (GLUCOTROL) 5 MG tablet Take 2 tabs by mouth every morning & 1 tab in the evening      . lisinopril (PRINIVIL,ZESTRIL) 5 MG tablet  Take 5 mg by mouth daily.      . metFORMIN (GLUCOPHAGE) 500 MG tablet Take 500 mg by mouth daily with breakfast.       . metoprolol (LOPRESSOR) 50 MG tablet Take 50 mg by mouth daily.       Marland Kitchen oxyCODONE-acetaminophen (PERCOCET/ROXICET) 5-325 MG per tablet Take 1 tablet by mouth every 8 (eight) hours as needed for pain.      . pantoprazole (PROTONIX) 40 MG tablet Take 1 tablet (40 mg total) by mouth daily.  30 tablet  5  . potassium chloride (K-DUR) 10 MEQ tablet Take 10 mEq by mouth as needed (only takes with furosemide).       . Tamsulosin HCl (FLOMAX) 0.4 MG CAPS Take 0.4 mg by mouth daily.        No current facility-administered medications for this visit.    Past Medical History  Diagnosis Date  . Hypertension   . Gout   . DM type 2 (diabetes mellitus, type 2)     for at least 10 years  . History of kidney stones   . Arthritis   . HOH (hard of hearing)     Past Surgical History  Procedure Laterality Date  . Spinal cyst  1946  . Ankle surgery Right 1961    removal of ankle bone  . Cataract extraction w/phaco  03/11/2012  Procedure: CATARACT EXTRACTION PHACO AND INTRAOCULAR LENS PLACEMENT (IOC);  Surgeon: Tonny Branch, MD;  Location: [facility];  Service: Ophthalmology;  Laterality: Left;  CDE 11.09  . Colonoscopy    . Colonoscopy N/A 03/01/2013    Procedure: COLONOSCOPY;  Surgeon: Rogene Houston, MD;  Location: [facility];  Service: Endoscopy;  Laterality: N/A;  730    History   Social History  . Marital Status: Married    Spouse Name: N/A    Number of Children: N/A  . Years of Education: N/A   Occupational History  . Not on file.   Social History Main Topics  . Smoking status: Former Smoker -- 2 years    Types: Cigarettes    Quit date: 05/05/1948  . Smokeless tobacco: Former Systems developer    Types: Chew    Quit date: 05/05/2002     Comment: used to chew tobacco, age 71   . Alcohol Use: No  . Drug Use: No  . Sexual Activity: Yes    Birth Control/ Protection: None    Other Topics Concern  . Not on file   Social History Narrative  . No narrative on file     Filed Vitals:   08/10/13 1123  BP: 133/69  Pulse: 56  Height: 5\' 9"  (1.753 m)  Weight: 177 lb (80.287 kg)    PHYSICAL EXAM General: NAD Neck: No JVD, no thyromegaly. Lungs: Clear to auscultation bilaterally with normal respiratory effort. CV: Nondisplaced PMI.  Regular rate and rhythm, normal S1/S2, no S3/S4, no murmur. No pretibial or periankle edema.  No carotid bruit.  Normal pedal pulses.  Abdomen: Soft, nontender, no hepatosplenomegaly, no distention.  Neurologic: Alert and oriented x 3.  Psych: Normal affect. Extremities: No clubbing or cyanosis.   ECG: reviewed and available in electronic records.       ASSESSMENT AND PLAN: 1. CAD: Continue ASA 81 mg daily. He is symptomatically stable. Continue Lipitor. He previously had a mildly abnormal stress test without high risk features and evidence of coronary calcifications on CT scan. Due to his age, poor functional capacity and no convincing symptoms of angina, I think continued medical therapy is the best option in his situation.  2. Mitral regurgitation: moderate when last checked and asymptomatic from this standpoint.  3. HTN: controlled on current therapy which includes low-dose lisinopril and amlodipine. I will discontinue amlodipine. I have asked him to check blood pressure in one week and to repeat it 2 or 3 times over the course of the following week. If his blood pressure is elevated, I would simply increase the dose of lisinopril. 4. Hyperlipidemia: continue Lipitor.   Dispo: f/u 6 months.  Kate Sable, M.D., F.A.C.C.

## 2013-11-29 ENCOUNTER — Other Ambulatory Visit: Payer: Self-pay | Admitting: Orthopedic Surgery

## 2013-12-09 ENCOUNTER — Encounter (HOSPITAL_COMMUNITY): Payer: Self-pay | Admitting: Pharmacy Technician

## 2013-12-12 NOTE — Patient Instructions (Signed)
Wesley Garrison  12/12/2013   Your procedure is scheduled on:  12/22/13    Report to [facility].  Follow the Signs to [place] at    100pm  Call this number if you have problems the morning of surgery: [phone]   Remember:   Do not eat food after midnite.  May have clear liquids until 0830am then npo.    Take these medicines the morning of surgery with A SIP OF WATER:    Do not wear jewelry,   Do not wear lotions, powders, or perfumes. , deodorant.  .  . Men may shave face and neck.  Do not bring valuables to the hospital.  Contacts, dentures or bridgework may not be worn into surgery.  Leave suitcase in the car. After surgery it may be brought to your room.  For patients admitted to the hospital, checkout time is 11:00 AM the day of  discharge.       CLEAR LIQUID DIET   Foods Allowed                                                                     Foods Excluded  Coffee and tea, regular and decaf                             liquids that you cannot  Plain Jell-O in any flavor                                             see through such as: Fruit ices (not with fruit pulp)                                     milk, soups, orange juice  Iced Popsicles                                    All solid food Carbonated beverages, regular and diet                                    Cranberry, grape and apple juices Sports drinks like Gatorade Lightly seasoned clear broth or consume(fat free) Sugar, honey syrup  Sample Menu Breakfast                                Lunch                                     Supper Cranberry juice                    Beef broth  Chicken broth Jell-O                                     Grape juice                           Apple juice Coffee or tea                        Jell-O                                      Popsicle                                                Coffee or tea                         Coffee or tea  _____________________________________________________________________  [facility] - Preparing for Surgery Before surgery, you can play an important role.  Because skin is not sterile, your skin needs to be as free of germs as possible.  You can reduce the number of germs on your skin by washing with CHG (chlorahexidine gluconate) soap before surgery.  CHG is an antiseptic cleaner which kills germs and bonds with the skin to continue killing germs even after washing. Please DO NOT use if you have an allergy to CHG or antibacterial soaps.  If your skin becomes reddened/irritated stop using the CHG and inform your nurse when you arrive at Short Stay. Do not shave (including legs and underarms) for at least 48 hours prior to the first CHG shower.  You may shave your face/neck. Please follow these instructions carefully:  1.  Shower with CHG Soap the night before surgery and the  morning of Surgery.  2.  If you choose to wash your hair, wash your hair first as usual with your  normal  shampoo.  3.  After you shampoo, rinse your hair and body thoroughly to remove the  shampoo.                           4.  Use CHG as you would any other liquid soap.  You can apply chg directly  to the skin and wash                       Gently with a scrungie or clean washcloth.  5.  Apply the CHG Soap to your body ONLY FROM THE NECK DOWN.   Do not use on face/ open                           Wound or open sores. Avoid contact with eyes, ears mouth and genitals (private parts).                       Wash face,  Genitals (private parts) with your normal soap.             6.  Wash thoroughly, paying special attention to the area  where your surgery  will be performed.  7.  Thoroughly rinse your body with warm water from the neck down.  8.  DO NOT shower/wash with your normal soap after using and rinsing off  the CHG Soap.                9.  Pat yourself dry with a clean towel.            10.  Wear clean  pajamas.            11.  Place clean sheets on your bed the night of your first shower and do not  sleep with pets. Day of Surgery : Do not apply any lotions/deodorants the morning of surgery.  Please wear clean clothes to the hospital/surgery center.  FAILURE TO FOLLOW THESE INSTRUCTIONS MAY RESULT IN THE CANCELLATION OF YOUR SURGERY PATIENT SIGNATURE_________________________________  NURSE SIGNATURE__________________________________  ________________________________________________________________________  WHAT IS A BLOOD TRANSFUSION? Blood Transfusion Information  A transfusion is the replacement of blood or some of its parts. Blood is made up of multiple cells which provide different functions.  Red blood cells carry oxygen and are used for blood loss replacement.  White blood cells fight against infection.  Platelets control bleeding.  Plasma helps clot blood.  Other blood products are available for specialized needs, such as hemophilia or other clotting disorders. BEFORE THE TRANSFUSION  Who gives blood for transfusions?   Healthy volunteers who are fully evaluated to make sure their blood is safe. This is blood bank blood. Transfusion therapy is the safest it has ever been in the practice of medicine. Before blood is taken from a donor, a complete history is taken to make sure that person has no history of diseases nor engages in risky social behavior (examples are intravenous drug use or sexual activity with multiple partners). The donor's travel history is screened to minimize risk of transmitting infections, such as malaria. The donated blood is tested for signs of infectious diseases, such as HIV and hepatitis. The blood is then tested to be sure it is compatible with you in order to minimize the chance of a transfusion reaction. If you or a relative donates blood, this is often done in anticipation of surgery and is not appropriate for emergency situations. It takes many days  to process the donated blood. RISKS AND COMPLICATIONS Although transfusion therapy is very safe and saves many lives, the main dangers of transfusion include:   Getting an infectious disease.  Developing a transfusion reaction. This is an allergic reaction to something in the blood you were given. Every precaution is taken to prevent this. The decision to have a blood transfusion has been considered carefully by your caregiver before blood is given. Blood is not given unless the benefits outweigh the risks. AFTER THE TRANSFUSION  Right after receiving a blood transfusion, you will usually feel much better and more energetic. This is especially true if your red blood cells have gotten low (anemic). The transfusion raises the level of the red blood cells which carry oxygen, and this usually causes an energy increase.  The nurse administering the transfusion will monitor you carefully for complications. HOME CARE INSTRUCTIONS  No special instructions are needed after a transfusion. You may find your energy is better. Speak with your caregiver about any limitations on activity for underlying diseases you may have. SEEK MEDICAL CARE IF:   Your condition is not improving after your transfusion.  You develop redness or irritation at the intravenous (  IV) site. SEEK IMMEDIATE MEDICAL CARE IF:  Any of the following symptoms occur over the next 12 hours:  Shaking chills.  You have a temperature by mouth above 102 F (38.9 C), not controlled by medicine.  Chest, back, or muscle pain.  People around you feel you are not acting correctly or are confused.  Shortness of breath or difficulty breathing.  Dizziness and fainting.  You get a rash or develop hives.  You have a decrease in urine output.  Your urine turns a dark color or changes to pink, red, or brown. Any of the following symptoms occur over the next 10 days:  You have a temperature by mouth above 102 F (38.9 C), not controlled  by medicine.  Shortness of breath.  Weakness after normal activity.  The white part of the eye turns yellow (jaundice).  You have a decrease in the amount of urine or are urinating less often.  Your urine turns a dark color or changes to pink, red, or brown. Document Released: 04/18/2000 Document Revised: 07/14/2011 Document Reviewed: 12/06/2007 ExitCare Patient Information 2014 [place].  _______________________________________________________________________  Incentive Spirometer  An incentive spirometer is a tool that can help keep your lungs clear and active. This tool measures how well you are filling your lungs with each breath. Taking long deep breaths may help reverse or decrease the chance of developing breathing (pulmonary) problems (especially infection) following:  A long period of time when you are unable to move or be active. BEFORE THE PROCEDURE   If the spirometer includes an indicator to show your best effort, your nurse or respiratory therapist will set it to a desired goal.  If possible, sit up straight or lean slightly forward. Try not to slouch.  Hold the incentive spirometer in an upright position. INSTRUCTIONS FOR USE  1. Sit on the edge of your bed if possible, or sit up as far as you can in bed or on a chair. 2. Hold the incentive spirometer in an upright position. 3. Breathe out normally. 4. Place the mouthpiece in your mouth and seal your lips tightly around it. 5. Breathe in slowly and as deeply as possible, raising the piston or the ball toward the top of the column. 6. Hold your breath for 3-5 seconds or for as long as possible. Allow the piston or ball to fall to the bottom of the column. 7. Remove the mouthpiece from your mouth and breathe out normally. 8. Rest for a few seconds and repeat Steps 1 through 7 at least 10 times every 1-2 hours when you are awake. Take your time and take a few normal breaths between deep breaths. 9. The  spirometer may include an indicator to show your best effort. Use the indicator as a goal to work toward during each repetition. 10. After each set of 10 deep breaths, practice coughing to be sure your lungs are clear. If you have an incision (the cut made at the time of surgery), support your incision when coughing by placing a pillow or rolled up towels firmly against it. Once you are able to get out of bed, walk around indoors and cough well. You may stop using the incentive spirometer when instructed by your caregiver.  RISKS AND COMPLICATIONS  Take your time so you do not get dizzy or light-headed.  If you are in pain, you may need to take or ask for pain medication before doing incentive spirometry. It is harder to take a deep breath if you  are having pain. AFTER USE  Rest and breathe slowly and easily.  It can be helpful to keep track of a log of your progress. Your caregiver can provide you with a simple table to help with this. If you are using the spirometer at home, follow these instructions: [place] IF:   You are having difficultly using the spirometer.  You have trouble using the spirometer as often as instructed.  Your pain medication is not giving enough relief while using the spirometer.  You develop fever of 100.5 F (38.1 C) or higher. SEEK IMMEDIATE MEDICAL CARE IF:   You cough up bloody sputum that had not been present before.  You develop fever of 102 F (38.9 C) or greater.  You develop worsening pain at or near the incision site. MAKE SURE YOU:   Understand these instructions.  Will watch your condition.  Will get help right away if you are not doing well or get worse. Document Released: 09/01/2006 Document Revised: 07/14/2011 Document Reviewed: 11/02/2006 ExitCare Patient Information 2014 ExitCare, [place].   ________________________________________________________________________    Please read over the following fact sheets that you were  given: MRSA Information, coughing and deep breathing exercises, leg exercises

## 2013-12-14 ENCOUNTER — Encounter (HOSPITAL_COMMUNITY): Payer: Self-pay

## 2013-12-14 ENCOUNTER — Encounter (HOSPITAL_COMMUNITY)
Admission: RE | Admit: 2013-12-14 | Discharge: 2013-12-14 | Disposition: A | Discharge status: Home / Self Care | Payer: Medicare Other | Source: Ambulatory Visit | Attending: Orthopedic Surgery | Admitting: Orthopedic Surgery

## 2013-12-14 ENCOUNTER — Ambulatory Visit (HOSPITAL_COMMUNITY)
Admission: RE | Admit: 2013-12-14 | Discharge: 2013-12-14 | Disposition: A | Discharge status: Home / Self Care | Payer: Medicare Other | Source: Ambulatory Visit | Attending: Orthopedic Surgery | Admitting: Orthopedic Surgery

## 2013-12-14 DIAGNOSIS — Z01818 Encounter for other preprocedural examination: Secondary | ICD-10-CM | POA: Diagnosis present

## 2013-12-14 HISTORY — DX: Gastro-esophageal reflux disease without esophagitis: K21.9

## 2013-12-14 LAB — COMPREHENSIVE METABOLIC PANEL
ALT: 20 U/L (ref 0–53)
AST: 24 U/L (ref 0–37)
Albumin: 3.9 g/dL (ref 3.5–5.2)
Alkaline Phosphatase: 62 U/L (ref 39–117)
Anion gap: 12 (ref 5–15)
BUN: 10 mg/dL (ref 6–23)
CALCIUM: 9.5 mg/dL (ref 8.4–10.5)
CO2: 25 mEq/L (ref 19–32)
Chloride: 102 mEq/L (ref 96–112)
Creatinine, Ser: 0.65 mg/dL (ref 0.50–1.35)
GFR calc Af Amer: >90 mL/min (ref >90)
GFR calc non Af Amer: 85 mL/min — ABNORMAL LOW (ref >90)
GLUCOSE: 74 mg/dL (ref 70–99)
Potassium: 4.1 mEq/L (ref 3.7–5.3)
Sodium: 139 mEq/L (ref 137–147)
TOTAL PROTEIN: 7.1 g/dL (ref 6.0–8.3)
Total Bilirubin: 0.3 mg/dL (ref 0.3–1.2)

## 2013-12-14 LAB — URINALYSIS, ROUTINE W REFLEX MICROSCOPIC
BILIRUBIN URINE: NEGATIVE
Glucose, UA: NEGATIVE mg/dL
Hgb urine dipstick: NEGATIVE
Ketones, ur: NEGATIVE mg/dL
LEUKOCYTES UA: NEGATIVE
NITRITE: NEGATIVE
Protein, ur: NEGATIVE mg/dL
Specific Gravity, Urine: 1.019 (ref 1.005–1.030)
UROBILINOGEN UA: 0.2 mg/dL (ref 0.0–1.0)
pH: 5.5 (ref 5.0–8.0)

## 2013-12-14 LAB — CBC
HEMATOCRIT: 43 % (ref 39.0–52.0)
HEMOGLOBIN: 14.5 g/dL (ref 13.0–17.0)
MCH: 28.9 pg (ref 26.0–34.0)
MCHC: 33.7 g/dL (ref 30.0–36.0)
MCV: 85.8 fL (ref 78.0–100.0)
Platelets: 198 10*3/uL (ref 150–400)
RBC: 5.01 MIL/uL (ref 4.22–5.81)
RDW: 13 % (ref 11.5–15.5)
WBC: 8.3 10*3/uL (ref 4.0–10.5)

## 2013-12-14 LAB — PROTIME-INR
INR: 1.12 (ref 0.00–1.49)
PROTHROMBIN TIME: 14.4 s (ref 11.6–15.2)

## 2013-12-14 LAB — SURGICAL PCR SCREEN
MRSA, PCR: NEGATIVE
STAPHYLOCOCCUS AUREUS: NEGATIVE

## 2013-12-14 LAB — ABO/RH: ABO/RH(D): B POS

## 2013-12-14 LAB — APTT: aPTT: 33 seconds (ref 24–37)

## 2013-12-14 NOTE — Progress Notes (Signed)
08/10/13 LOV with Dr Bronson Ing EPIC  ECHO- 06/07/12 EPIC  Stress Test 07/29/12 EPIC  DR Vyas- 10/24/13 clearance LOV 11/22/13 on chart

## 2013-12-15 ENCOUNTER — Other Ambulatory Visit: Payer: Medicare Other | Admitting: Surgical

## 2013-12-15 MED ORDER — TRANEXAMIC ACID 100 MG/ML IV SOLN
2000.0000 mg | Freq: Once | INTRAVENOUS | Status: AC
  Filled 2013-12-15: qty 20

## 2013-12-15 NOTE — H&P (Signed)
TOTAL HIP ADMISSION H&P  Patient is admitted for right total hip arthroplasty.  Subjective:  Chief Complaint: right hip pain  HPI: Wesley Garrison, 80 y.o. male, has a history of pain and functional disability in the right hip(s) due to arthritis and patient has failed non-surgical conservative treatments for greater than 12 weeks to include NSAID's and/or analgesics, corticosteriod injections, use of assistive devices and activity modification.  Onset of symptoms was gradual starting 3 years ago with gradually worsening course since that time.The patient noted no past surgery on the right hip(s).  Patient currently rates pain in the right hip at 8 out of 10 with activity. Patient has night pain, worsening of pain with activity and weight bearing, pain that interfers with activities of daily living and pain with passive range of motion. Patient has evidence of subchondral cysts, periarticular osteophytes and joint space narrowing by imaging studies. This condition presents safety issues increasing the risk of falls.  There is no current active infection.  Patient Active Problem List   Diagnosis Date Noted  . Heme positive stool 02/21/2013  . Diabetes 02/21/2013  . High cholesterol 02/21/2013  . Gout 02/21/2013  . Mitral regurgitation 08/27/2012  . Dyspnea 07/15/2012  . Coronary atherosclerosis of native coronary artery 07/15/2012   Past Medical History  Diagnosis Date  . Hypertension   . Gout   . DM type 2 (diabetes mellitus, type 2)     for at least 10 years  . History of kidney stones   . Arthritis   . HOH (hard of hearing)   . GERD (gastroesophageal reflux disease)     Past Surgical History  Procedure Laterality Date  . Spinal cyst  1946  . Ankle surgery Right 1961    removal of ankle bone  . Cataract extraction w/phaco  03/11/2012    Procedure: CATARACT EXTRACTION PHACO AND INTRAOCULAR LENS PLACEMENT (IOC);  Surgeon: Tonny Branch, MD;  Location: [facility] ORS;  Service: Ophthalmology;   Laterality: Left;  CDE 11.09  . Colonoscopy    . Colonoscopy N/A 03/01/2013    Procedure: COLONOSCOPY;  Surgeon: Rogene Houston, MD;  Location: [facility] ENDO SUITE;  Service: Endoscopy;  Laterality: N/A;  730  . Eye surgery       Current outpatient prescriptions: aspirin EC 81 MG tablet, Take 81 mg by mouth every morning. , Disp: , Rfl: ;   atorvastatin (LIPITOR) 10 MG tablet, Take 10 mg by mouth every evening. , Disp: , Rfl: ;   fluticasone (FLONASE) 50 MCG/ACT nasal spray, Place 2 sprays into the nose daily as needed for allergies. , Disp: , Rfl: ;  furosemide (LASIX) 20 MG tablet, Take 20 mg by mouth daily as needed for fluid. , Disp: , Rfl:  glipiZIDE (GLUCOTROL) 5 MG tablet, Take 5-10 mg by mouth 2 (two) times daily before a meal. He takes two tablets in the morning and one tablet at night., Disp: , Rfl: ;   lisinopril (PRINIVIL,ZESTRIL) 20 MG tablet, Take 20 mg by mouth 2 (two) times daily., Disp: , Rfl: ;   metFORMIN (GLUCOPHAGE) 500 MG tablet, Take 500 mg by mouth daily with breakfast. , Disp: , Rfl:  metoprolol (LOPRESSOR) 50 MG tablet, Take 50 mg by mouth 2 (two) times daily. , Disp: , Rfl: ;   Multiple Vitamin (MULTIVITAMIN WITH MINERALS) TABS tablet, Take 1 tablet by mouth every morning., Disp: , Rfl: ;   oxyCODONE-acetaminophen (PERCOCET/ROXICET) 5-325 MG per tablet, Take 1 tablet by mouth every 8 (eight)  hours as needed for pain., Disp: , Rfl: ;   pantoprazole (PROTONIX) 40 MG tablet, Take 40 mg by mouth every morning., Disp: , Rfl:  Tamsulosin HCl (FLOMAX) 0.4 MG CAPS, Take 0.4 mg by mouth 2 (two) times daily. , Disp: , Rfl:   No Known Allergies  History  Substance Use Topics  . Smoking status: Former Smoker -- 2 years    Types: Cigarettes    Quit date: 05/05/1948  . Smokeless tobacco: Former Systems developer    Types: Chew    Quit date: 05/05/2002     Comment: used to chew tobacco, age 68   . Alcohol Use: No    Family History  Problem Relation Age of Onset  . Diabetes Mother       Review of Systems  Constitutional: Positive for diaphoresis. Negative for fever, chills, weight loss and malaise/fatigue.  HENT: Negative.   Eyes: Negative.   Respiratory: Negative.   Cardiovascular: Negative.   Gastrointestinal: Negative.   Genitourinary: Positive for frequency. Negative for dysuria, urgency, hematuria and flank pain.  Musculoskeletal: Positive for back pain and joint pain. Negative for falls, myalgias and neck pain.       Right hip pain  Skin: Negative.   Neurological: Negative.  Negative for weakness.  Endo/Heme/Allergies: Negative.   Psychiatric/Behavioral: Negative.     Objective:  Physical Exam  Constitutional: He is oriented to person, place, and time. He appears well-developed and well-nourished. No distress.  HENT:  Head: Normocephalic and atraumatic.  Right Ear: External ear normal.  Left Ear: External ear normal.  Nose: Nose normal.  Mouth/Throat: Oropharynx is clear and moist.  Eyes: Conjunctivae and EOM are normal.  Neck: Normal range of motion. Neck supple. No tracheal deviation present. No thyromegaly present.  Cardiovascular: Normal rate, regular rhythm and intact distal pulses.   Murmur heard.  Systolic murmur is present with a grade of 3/6  Respiratory: Effort normal and breath sounds normal. No respiratory distress. He has no wheezes.  GI: Soft. Bowel sounds are normal. He exhibits no distension. There is no tenderness.  Musculoskeletal:       Right hip: He exhibits decreased range of motion, decreased strength and crepitus. He exhibits no tenderness.       Left hip: Normal.       Right knee: Normal.       Left knee: Normal.       Right lower leg: He exhibits no tenderness and no swelling.       Left lower leg: He exhibits no tenderness and no swelling.  His left hip has a normal range of motion. No discomfort. Right hip flexion is to 90. No internal rotation, about 10-20 of external rotation, 10-20 of abduction. He has pain on all of  those motions. He is nontender about the hip. The knee exam is unremarkable.  Neurological: He is alert and oriented to person, place, and time. He has normal strength and normal reflexes. No sensory deficit.  Skin: Skin is dry. No rash noted. He is not diaphoretic. No erythema.  Psychiatric: He has a normal mood and affect. His behavior is normal.    Vital signs in last 24 hours: Temp:  [97.6 F (36.4 C)] 97.6 F (36.4 C) (08/12 1454) Pulse Rate:  [71] 71 (08/12 1454) Resp:  [16] 16 (08/12 1454) BP: (156)/(69) 156/69 mmHg (08/12 1454) Weight:  [81.647 kg (180 lb)] 81.647 kg (180 lb) (08/12 1454)  Labs:   Estimated body mass index is 26.13 kg/(m^2) as  calculated from the following:   Height as of 08/10/13: 5\' 9"  (1.753 m).   Weight as of 08/10/13: 80.287 kg (177 lb).   Imaging Review Plain radiographs demonstrate severe degenerative joint disease of the right hip(s). The bone quality appears to be good for age and reported activity level.  Assessment/Plan:  End stage arthritis, right hip(s)  The patient history, physical examination, clinical judgement of the provider and imaging studies are consistent with end stage degenerative joint disease of the right hip(s) and total hip arthroplasty is deemed medically necessary. The treatment options including medical management, injection therapy, arthroscopy and arthroplasty were discussed at length. The risks and benefits of total hip arthroplasty were presented and reviewed. The risks due to aseptic loosening, infection, stiffness, dislocation/subluxation,  thromboembolic complications and other imponderables were discussed.  The patient acknowledged the explanation, agreed to proceed with the plan and consent was signed. Patient is being admitted for inpatient treatment for surgery, pain control, PT, OT, prophylactic antibiotics, VTE prophylaxis, progressive ambulation and ADL's and discharge planning.The patient is planning to be discharged  home with home health services    PCP: Dr. Vernard Gambles Dr. Willy Eddy, PA-C

## 2013-12-22 ENCOUNTER — Encounter (HOSPITAL_COMMUNITY): Payer: Self-pay

## 2013-12-22 ENCOUNTER — Inpatient Hospital Stay (HOSPITAL_COMMUNITY): Payer: Medicare Other

## 2013-12-22 ENCOUNTER — Encounter (HOSPITAL_COMMUNITY): Payer: Medicare Other | Admitting: Anesthesiology

## 2013-12-22 ENCOUNTER — Inpatient Hospital Stay (HOSPITAL_COMMUNITY): Payer: Medicare Other | Admitting: Anesthesiology

## 2013-12-22 ENCOUNTER — Encounter (HOSPITAL_COMMUNITY)
Admission: RE | Disposition: A | Discharge status: Home Health | Payer: Self-pay | Source: Ambulatory Visit | Attending: Orthopedic Surgery

## 2013-12-22 ENCOUNTER — Inpatient Hospital Stay (HOSPITAL_COMMUNITY)
Admission: RE | Admit: 2013-12-22 | Discharge: 2013-12-24 | DRG: 470 | Disposition: A | Discharge status: Home Health | Payer: Medicare Other | Source: Ambulatory Visit | Attending: Orthopedic Surgery | Admitting: Orthopedic Surgery

## 2013-12-22 DIAGNOSIS — Z7982 Long term (current) use of aspirin: Secondary | ICD-10-CM

## 2013-12-22 DIAGNOSIS — M1611 Unilateral primary osteoarthritis, right hip: Secondary | ICD-10-CM

## 2013-12-22 DIAGNOSIS — E7800 Pure hypercholesterolemia, unspecified: Secondary | ICD-10-CM | POA: Diagnosis present

## 2013-12-22 DIAGNOSIS — I10 Essential (primary) hypertension: Secondary | ICD-10-CM | POA: Diagnosis present

## 2013-12-22 DIAGNOSIS — M25559 Pain in unspecified hip: Secondary | ICD-10-CM | POA: Diagnosis present

## 2013-12-22 DIAGNOSIS — Z6826 Body mass index (BMI) 26.0-26.9, adult: Secondary | ICD-10-CM

## 2013-12-22 DIAGNOSIS — Z87891 Personal history of nicotine dependence: Secondary | ICD-10-CM

## 2013-12-22 DIAGNOSIS — I2510 Atherosclerotic heart disease of native coronary artery without angina pectoris: Secondary | ICD-10-CM | POA: Diagnosis present

## 2013-12-22 DIAGNOSIS — M1610 Unilateral primary osteoarthritis, unspecified hip: Principal | ICD-10-CM | POA: Diagnosis present

## 2013-12-22 DIAGNOSIS — M109 Gout, unspecified: Secondary | ICD-10-CM | POA: Diagnosis present

## 2013-12-22 DIAGNOSIS — K219 Gastro-esophageal reflux disease without esophagitis: Secondary | ICD-10-CM | POA: Diagnosis present

## 2013-12-22 DIAGNOSIS — H9190 Unspecified hearing loss, unspecified ear: Secondary | ICD-10-CM | POA: Diagnosis present

## 2013-12-22 DIAGNOSIS — M169 Osteoarthritis of hip, unspecified: Secondary | ICD-10-CM | POA: Diagnosis not present

## 2013-12-22 DIAGNOSIS — Z87442 Personal history of urinary calculi: Secondary | ICD-10-CM | POA: Diagnosis not present

## 2013-12-22 DIAGNOSIS — Z79899 Other long term (current) drug therapy: Secondary | ICD-10-CM | POA: Diagnosis not present

## 2013-12-22 DIAGNOSIS — E119 Type 2 diabetes mellitus without complications: Secondary | ICD-10-CM | POA: Diagnosis present

## 2013-12-22 HISTORY — PX: TOTAL HIP ARTHROPLASTY: SHX124

## 2013-12-22 LAB — GLUCOSE, CAPILLARY
GLUCOSE-CAPILLARY: 140 mg/dL — AB (ref 70–99)
Glucose-Capillary: 120 mg/dL — ABNORMAL HIGH (ref 70–99)
Glucose-Capillary: 112 mg/dL — ABNORMAL HIGH (ref 70–99)

## 2013-12-22 LAB — TYPE AND SCREEN
ABO/RH(D): B POS
ANTIBODY SCREEN: NEGATIVE

## 2013-12-22 SURGERY — ARTHROPLASTY, HIP, TOTAL, ANTERIOR APPROACH
Anesthesia: Spinal | Site: Hip | Laterality: Right

## 2013-12-22 MED ORDER — PROPOFOL 10 MG/ML IV BOLUS
INTRAVENOUS | Status: AC
  Filled 2013-12-22: qty 20

## 2013-12-22 MED ORDER — SODIUM CHLORIDE 0.9 % IV SOLN
INTRAVENOUS | Status: DC
  Administered 2013-12-22: 20:00:00 via INTRAVENOUS

## 2013-12-22 MED ORDER — SODIUM CHLORIDE 0.9 % IV SOLN: INTRAVENOUS | Status: DC

## 2013-12-22 MED ORDER — ONDANSETRON HCL 4 MG/2ML IJ SOLN
4.0000 mg | Freq: Four times a day (QID) | INTRAMUSCULAR | Status: DC | PRN
Start: 2013-12-22 — End: 2013-12-24

## 2013-12-22 MED ORDER — METHOCARBAMOL 500 MG PO TABS
500.0000 mg | ORAL_TABLET | Freq: Four times a day (QID) | ORAL | Status: DC | PRN
  Administered 2013-12-23 – 2013-12-24 (×3): 500 mg via ORAL
  Filled 2013-12-22 (×4): qty 1

## 2013-12-22 MED ORDER — PROMETHAZINE HCL 25 MG/ML IJ SOLN: 6.2500 mg | INTRAMUSCULAR | Status: DC | PRN

## 2013-12-22 MED ORDER — OXYCODONE HCL 5 MG PO TABS
5.0000 mg | ORAL_TABLET | ORAL | Status: DC | PRN
  Administered 2013-12-22 – 2013-12-23 (×5): 10 mg via ORAL
  Administered 2013-12-23: 5 mg via ORAL
  Administered 2013-12-23 – 2013-12-24 (×3): 10 mg via ORAL
  Filled 2013-12-22 (×3): qty 2
  Filled 2013-12-22: qty 1
  Filled 2013-12-22 (×5): qty 2

## 2013-12-22 MED ORDER — BUPIVACAINE LIPOSOME 1.3 % IJ SUSP
INTRAMUSCULAR | Status: DC | PRN
  Administered 2013-12-22: 20 mL

## 2013-12-22 MED ORDER — GLIPIZIDE 10 MG PO TABS
10.0000 mg | ORAL_TABLET | Freq: Every day | ORAL | Status: DC
  Administered 2013-12-23 – 2013-12-24 (×2): 10 mg via ORAL
  Filled 2013-12-22 (×4): qty 1

## 2013-12-22 MED ORDER — DEXAMETHASONE 6 MG PO TABS
10.0000 mg | ORAL_TABLET | Freq: Every day | ORAL | Status: AC
  Administered 2013-12-23: 10 mg via ORAL
  Filled 2013-12-22: qty 1

## 2013-12-22 MED ORDER — PROPOFOL INFUSION 10 MG/ML OPTIME
INTRAVENOUS | Status: DC | PRN
Start: 2013-12-22 — End: 2013-12-22
  Administered 2013-12-22: 75 ug/kg/min via INTRAVENOUS

## 2013-12-22 MED ORDER — BUPIVACAINE HCL (PF) 0.25 % IJ SOLN
INTRAMUSCULAR | Status: AC
  Filled 2013-12-22: qty 30

## 2013-12-22 MED ORDER — LACTATED RINGERS IV SOLN
INTRAVENOUS | Status: DC | PRN
  Administered 2013-12-22 (×2): via INTRAVENOUS

## 2013-12-22 MED ORDER — METFORMIN HCL 500 MG PO TABS
500.0000 mg | ORAL_TABLET | Freq: Every day | ORAL | Status: DC
  Administered 2013-12-23 – 2013-12-24 (×2): 500 mg via ORAL
  Filled 2013-12-22 (×3): qty 1

## 2013-12-22 MED ORDER — DEXAMETHASONE SODIUM PHOSPHATE 10 MG/ML IJ SOLN
INTRAMUSCULAR | Status: DC | PRN
  Administered 2013-12-22: 10 mg via INTRAVENOUS

## 2013-12-22 MED ORDER — METOCLOPRAMIDE HCL 5 MG/ML IJ SOLN: 5.0000 mg | Freq: Three times a day (TID) | INTRAMUSCULAR | Status: DC | PRN

## 2013-12-22 MED ORDER — RIVAROXABAN 10 MG PO TABS
10.0000 mg | ORAL_TABLET | Freq: Every day | ORAL | Status: DC
  Administered 2013-12-23 – 2013-12-24 (×2): 10 mg via ORAL
  Filled 2013-12-22 (×3): qty 1

## 2013-12-22 MED ORDER — BUPIVACAINE LIPOSOME 1.3 % IJ SUSP
20.0000 mL | Freq: Once | INTRAMUSCULAR | Status: DC
  Filled 2013-12-22: qty 20

## 2013-12-22 MED ORDER — ACETAMINOPHEN 325 MG PO TABS: 650.0000 mg | ORAL_TABLET | Freq: Four times a day (QID) | ORAL | Status: DC | PRN

## 2013-12-22 MED ORDER — DOCUSATE SODIUM 100 MG PO CAPS
100.0000 mg | ORAL_CAPSULE | Freq: Two times a day (BID) | ORAL | Status: DC
  Administered 2013-12-22 – 2013-12-24 (×4): 100 mg via ORAL

## 2013-12-22 MED ORDER — SODIUM CHLORIDE 0.9 % IJ SOLN
INTRAMUSCULAR | Status: AC
  Filled 2013-12-22: qty 10

## 2013-12-22 MED ORDER — CHLORHEXIDINE GLUCONATE 4 % EX LIQD: 60.0000 mL | Freq: Once | CUTANEOUS | Status: DC

## 2013-12-22 MED ORDER — FENTANYL CITRATE 0.05 MG/ML IJ SOLN: 25.0000 ug | INTRAMUSCULAR | Status: DC | PRN

## 2013-12-22 MED ORDER — FUROSEMIDE 20 MG PO TABS
20.0000 mg | ORAL_TABLET | Freq: Every day | ORAL | Status: DC | PRN
  Filled 2013-12-22: qty 1

## 2013-12-22 MED ORDER — FENTANYL CITRATE 0.05 MG/ML IJ SOLN
INTRAMUSCULAR | Status: AC
  Filled 2013-12-22: qty 2

## 2013-12-22 MED ORDER — MIDAZOLAM HCL 2 MG/2ML IJ SOLN
INTRAMUSCULAR | Status: AC
  Filled 2013-12-22: qty 2

## 2013-12-22 MED ORDER — FLUTICASONE PROPIONATE 50 MCG/ACT NA SUSP
2.0000 | Freq: Every day | NASAL | Status: DC | PRN
  Filled 2013-12-22: qty 16

## 2013-12-22 MED ORDER — EPHEDRINE SULFATE 50 MG/ML IJ SOLN
INTRAMUSCULAR | Status: DC | PRN
  Administered 2013-12-22: 5 mg via INTRAVENOUS

## 2013-12-22 MED ORDER — INSULIN ASPART 100 UNIT/ML ~~LOC~~ SOLN
0.0000 [IU] | Freq: Three times a day (TID) | SUBCUTANEOUS | Status: DC
  Administered 2013-12-23: 5 [IU] via SUBCUTANEOUS
  Administered 2013-12-23: 2 [IU] via SUBCUTANEOUS
  Administered 2013-12-23 – 2013-12-24 (×2): 3 [IU] via SUBCUTANEOUS

## 2013-12-22 MED ORDER — SODIUM CHLORIDE 0.9 % IV SOLN
10.0000 mg | INTRAVENOUS | Status: DC | PRN
  Administered 2013-12-22: 20 ug/min via INTRAVENOUS

## 2013-12-22 MED ORDER — METOCLOPRAMIDE HCL 10 MG PO TABS: 5.0000 mg | ORAL_TABLET | Freq: Three times a day (TID) | ORAL | Status: DC | PRN

## 2013-12-22 MED ORDER — CEFAZOLIN SODIUM-DEXTROSE 2-3 GM-% IV SOLR
2.0000 g | Freq: Four times a day (QID) | INTRAVENOUS | Status: AC
  Administered 2013-12-22 – 2013-12-23 (×2): 2 g via INTRAVENOUS
  Filled 2013-12-22 (×2): qty 50

## 2013-12-22 MED ORDER — DEXAMETHASONE SODIUM PHOSPHATE 10 MG/ML IJ SOLN: 10.0000 mg | Freq: Once | INTRAMUSCULAR | Status: DC

## 2013-12-22 MED ORDER — PHENOL 1.4 % MT LIQD
1.0000 | OROMUCOSAL | Status: DC | PRN
Start: 2013-12-22 — End: 2013-12-24

## 2013-12-22 MED ORDER — ATORVASTATIN CALCIUM 10 MG PO TABS
10.0000 mg | ORAL_TABLET | Freq: Every evening | ORAL | Status: DC
  Administered 2013-12-22 – 2013-12-23 (×2): 10 mg via ORAL
  Filled 2013-12-22 (×3): qty 1

## 2013-12-22 MED ORDER — ONDANSETRON HCL 4 MG/2ML IJ SOLN
INTRAMUSCULAR | Status: AC
  Filled 2013-12-22: qty 2

## 2013-12-22 MED ORDER — FENTANYL CITRATE 0.05 MG/ML IJ SOLN
INTRAMUSCULAR | Status: DC | PRN
  Administered 2013-12-22 (×2): 50 ug via INTRAVENOUS

## 2013-12-22 MED ORDER — MENTHOL 3 MG MT LOZG
1.0000 | LOZENGE | OROMUCOSAL | Status: DC | PRN
Start: 2013-12-22 — End: 2013-12-24

## 2013-12-22 MED ORDER — TRAMADOL HCL 50 MG PO TABS: 50.0000 mg | ORAL_TABLET | Freq: Four times a day (QID) | ORAL | Status: DC | PRN

## 2013-12-22 MED ORDER — TRANEXAMIC ACID 100 MG/ML IV SOLN
2000.0000 mg | INTRAVENOUS | Status: DC | PRN
  Administered 2013-12-22: 2000 mg via INTRAVENOUS

## 2013-12-22 MED ORDER — GLIPIZIDE 5 MG PO TABS
5.0000 mg | ORAL_TABLET | Freq: Every day | ORAL | Status: DC
  Administered 2013-12-22 – 2013-12-23 (×2): 5 mg via ORAL
  Filled 2013-12-22 (×3): qty 1

## 2013-12-22 MED ORDER — MORPHINE SULFATE 2 MG/ML IJ SOLN: 1.0000 mg | INTRAMUSCULAR | Status: DC | PRN

## 2013-12-22 MED ORDER — BUPIVACAINE IN DEXTROSE 0.75-8.25 % IT SOLN
INTRATHECAL | Status: DC | PRN
  Administered 2013-12-22: 2 mL via INTRATHECAL

## 2013-12-22 MED ORDER — GLIPIZIDE 5 MG PO TABS: 5.0000 mg | ORAL_TABLET | Freq: Two times a day (BID) | ORAL | Status: DC

## 2013-12-22 MED ORDER — CEFAZOLIN SODIUM-DEXTROSE 2-3 GM-% IV SOLR
2.0000 g | INTRAVENOUS | Status: AC
  Administered 2013-12-22: 2 g via INTRAVENOUS

## 2013-12-22 MED ORDER — LACTATED RINGERS IV SOLN
INTRAVENOUS | Status: DC
  Administered 2013-12-22: 1000 mL via INTRAVENOUS

## 2013-12-22 MED ORDER — PANTOPRAZOLE SODIUM 40 MG PO TBEC
40.0000 mg | DELAYED_RELEASE_TABLET | Freq: Every morning | ORAL | Status: DC
  Administered 2013-12-23 – 2013-12-24 (×2): 40 mg via ORAL
  Filled 2013-12-22 (×2): qty 1

## 2013-12-22 MED ORDER — CEFAZOLIN SODIUM-DEXTROSE 2-3 GM-% IV SOLR
INTRAVENOUS | Status: AC
  Filled 2013-12-22: qty 50

## 2013-12-22 MED ORDER — DEXTROSE 5 % IV SOLN
500.0000 mg | Freq: Four times a day (QID) | INTRAVENOUS | Status: DC | PRN
  Administered 2013-12-22: 500 mg via INTRAVENOUS
  Filled 2013-12-22: qty 5

## 2013-12-22 MED ORDER — DIPHENHYDRAMINE HCL 12.5 MG/5ML PO ELIX: 12.5000 mg | ORAL_SOLUTION | ORAL | Status: DC | PRN

## 2013-12-22 MED ORDER — KETOROLAC TROMETHAMINE 15 MG/ML IJ SOLN
7.5000 mg | Freq: Four times a day (QID) | INTRAMUSCULAR | Status: AC | PRN
  Administered 2013-12-22: 7.5 mg via INTRAVENOUS
  Filled 2013-12-22: qty 1

## 2013-12-22 MED ORDER — METOPROLOL TARTRATE 50 MG PO TABS
50.0000 mg | ORAL_TABLET | Freq: Two times a day (BID) | ORAL | Status: DC
  Administered 2013-12-22 – 2013-12-24 (×4): 50 mg via ORAL
  Filled 2013-12-22 (×5): qty 1

## 2013-12-22 MED ORDER — SODIUM CHLORIDE 0.9 % IJ SOLN
INTRAMUSCULAR | Status: AC
  Filled 2013-12-22: qty 50

## 2013-12-22 MED ORDER — ONDANSETRON HCL 4 MG/2ML IJ SOLN
INTRAMUSCULAR | Status: DC | PRN
  Administered 2013-12-22: 4 mg via INTRAVENOUS

## 2013-12-22 MED ORDER — BUPIVACAINE HCL (PF) 0.25 % IJ SOLN
INTRAMUSCULAR | Status: DC | PRN
  Administered 2013-12-22: 20 mL

## 2013-12-22 MED ORDER — FLEET ENEMA 7-19 GM/118ML RE ENEM: 1.0000 | ENEMA | Freq: Once | RECTAL | Status: AC | PRN

## 2013-12-22 MED ORDER — ACETAMINOPHEN 500 MG PO TABS
1000.0000 mg | ORAL_TABLET | Freq: Four times a day (QID) | ORAL | Status: AC
  Administered 2013-12-22 – 2013-12-23 (×4): 1000 mg via ORAL
  Filled 2013-12-22 (×4): qty 2

## 2013-12-22 MED ORDER — SODIUM CHLORIDE 0.9 % IJ SOLN
INTRAMUSCULAR | Status: DC | PRN
  Administered 2013-12-22: 30 mL

## 2013-12-22 MED ORDER — TAMSULOSIN HCL 0.4 MG PO CAPS
0.4000 mg | ORAL_CAPSULE | Freq: Two times a day (BID) | ORAL | Status: DC
  Administered 2013-12-22 – 2013-12-24 (×4): 0.4 mg via ORAL
  Filled 2013-12-22 (×5): qty 1

## 2013-12-22 MED ORDER — ACETAMINOPHEN 10 MG/ML IV SOLN
1000.0000 mg | Freq: Once | INTRAVENOUS | Status: AC
  Administered 2013-12-22: 1000 mg via INTRAVENOUS
  Filled 2013-12-22: qty 100

## 2013-12-22 MED ORDER — POLYETHYLENE GLYCOL 3350 17 G PO PACK
17.0000 g | PACK | Freq: Every day | ORAL | Status: DC | PRN
Start: 2013-12-22 — End: 2013-12-24

## 2013-12-22 MED ORDER — BISACODYL 10 MG RE SUPP: 10.0000 mg | Freq: Every day | RECTAL | Status: DC | PRN

## 2013-12-22 MED ORDER — ONDANSETRON HCL 4 MG PO TABS: 4.0000 mg | ORAL_TABLET | Freq: Four times a day (QID) | ORAL | Status: DC | PRN

## 2013-12-22 MED ORDER — ACETAMINOPHEN 650 MG RE SUPP: 650.0000 mg | Freq: Four times a day (QID) | RECTAL | Status: DC | PRN

## 2013-12-22 MED ORDER — DEXAMETHASONE SODIUM PHOSPHATE 10 MG/ML IJ SOLN
10.0000 mg | Freq: Every day | INTRAMUSCULAR | Status: AC
  Filled 2013-12-22: qty 1

## 2013-12-22 SURGICAL SUPPLY — 40 items
BAG ZIPLOCK 12X15 (MISCELLANEOUS) IMPLANT
BLADE EXTENDED COATED 6.5IN (ELECTRODE) ×3 IMPLANT
BLADE SAG 18X100X1.27 (BLADE) ×3 IMPLANT
CAPT HIP PF MOP ×3 IMPLANT
CLOSURE WOUND 1/2 X4 (GAUZE/BANDAGES/DRESSINGS) ×1
COVER PERINEAL POST (MISCELLANEOUS) ×3 IMPLANT
DECANTER SPIKE VIAL GLASS SM (MISCELLANEOUS) ×3 IMPLANT
DRAPE C-ARM 42X120 X-RAY (DRAPES) ×3 IMPLANT
DRAPE STERI IOBAN 125X83 (DRAPES) ×3 IMPLANT
DRAPE U-SHAPE 47X51 STRL (DRAPES) ×3 IMPLANT
DRSG ADAPTIC 3X8 NADH LF (GAUZE/BANDAGES/DRESSINGS) ×3 IMPLANT
DRSG MEPILEX BORDER 4X4 (GAUZE/BANDAGES/DRESSINGS) ×3 IMPLANT
DRSG MEPILEX BORDER 4X8 (GAUZE/BANDAGES/DRESSINGS) ×3 IMPLANT
DURAPREP 26ML APPLICATOR (WOUND CARE) ×3 IMPLANT
ELECT REM PT RETURN 9FT ADLT (ELECTROSURGICAL) ×3
ELECTRODE REM PT RTRN 9FT ADLT (ELECTROSURGICAL) ×1 IMPLANT
EVACUATOR 1/8 PVC DRAIN (DRAIN) ×3 IMPLANT
FACESHIELD WRAPAROUND (MASK) ×12 IMPLANT
GAUZE SPONGE 4X4 12PLY STRL (GAUZE/BANDAGES/DRESSINGS) IMPLANT
GLOVE BIO SURGEON STRL SZ7.5 (GLOVE) ×3 IMPLANT
GLOVE BIO SURGEON STRL SZ8 (GLOVE) ×6 IMPLANT
GLOVE BIOGEL PI IND STRL 8 (GLOVE) ×2 IMPLANT
GLOVE BIOGEL PI INDICATOR 8 (GLOVE) ×4
GOWN STRL REUS W/TWL LRG LVL3 (GOWN DISPOSABLE) ×3 IMPLANT
GOWN STRL REUS W/TWL XL LVL3 (GOWN DISPOSABLE) ×3 IMPLANT
KIT BASIN OR (CUSTOM PROCEDURE TRAY) ×3 IMPLANT
NDL SAFETY ECLIPSE 18X1.5 (NEEDLE) ×2 IMPLANT
NEEDLE HYPO 18GX1.5 SHARP (NEEDLE) ×4
PACK TOTAL JOINT (CUSTOM PROCEDURE TRAY) ×3 IMPLANT
STRIP CLOSURE SKIN 1/2X4 (GAUZE/BANDAGES/DRESSINGS) ×2 IMPLANT
SUT ETHIBOND NAB CT1 #1 30IN (SUTURE) ×3 IMPLANT
SUT MNCRL AB 4-0 PS2 18 (SUTURE) ×3 IMPLANT
SUT VIC AB 2-0 CT1 27 (SUTURE) ×4
SUT VIC AB 2-0 CT1 TAPERPNT 27 (SUTURE) ×2 IMPLANT
SUT VLOC 180 0 24IN GS25 (SUTURE) ×3 IMPLANT
SYRINGE 20CC LL (MISCELLANEOUS) ×3 IMPLANT
SYRINGE 60CC LL (MISCELLANEOUS) ×3 IMPLANT
TOWEL OR 17X26 10 PK STRL BLUE (TOWEL DISPOSABLE) ×3 IMPLANT
TRAY FOLEY CATH 14FRSI W/METER (CATHETERS) IMPLANT
TRAY FOLEY CATH 16FRSI W/METER (SET/KITS/TRAYS/PACK) ×3 IMPLANT

## 2013-12-22 NOTE — Anesthesia Procedure Notes (Signed)
Spinal  Patient location during procedure: OR Start time: 12/22/2013 3:19 PM Staffing CRNA/Resident: Dion Saucier E Performed by: resident/CRNA  Preanesthetic Checklist Completed: patient identified, site marked, surgical consent, pre-op evaluation, timeout performed, IV checked, risks and benefits discussed and monitors and equipment checked Spinal Block Patient position: sitting Prep: Betadine and site prepped and draped Patient monitoring: continuous pulse ox, blood pressure and heart rate Approach: midline Location: L2-3 Injection technique: single-shot Needle Needle type: Spinocan  Needle gauge: 22 G Needle length: 9 cm Additional Notes Kit expiration date checked, lot # [id number].  Initial pain midline on spinal needle placement resolved with more local. Negative heme, paresthesias, tolerated well.

## 2013-12-22 NOTE — Anesthesia Preprocedure Evaluation (Addendum)
Anesthesia Evaluation  Patient identified by MRN, date of birth, ID band Patient awake    Reviewed: Allergy & Precautions, H&P , NPO status , Patient's Chart, lab work & pertinent test results  Airway Mallampati: II TM Distance: >3 FB Neck ROM: Full    Dental no notable dental hx.    Pulmonary neg pulmonary ROS, former smoker,  breath sounds clear to auscultation  Pulmonary exam normal       Cardiovascular hypertension, Pt. on medications and Pt. on home beta blockers + CAD + Valvular Problems/Murmurs MR Rhythm:Regular Rate:Normal + Systolic murmurs Nl EF via echo 2014   Neuro/Psych negative neurological ROS  negative psych ROS   GI/Hepatic negative GI ROS, Neg liver ROS, GERD-  Medicated,  Endo/Other  diabetes  Renal/GU negative Renal ROS  negative genitourinary   Musculoskeletal negative musculoskeletal ROS (+)   Abdominal   Peds negative pediatric ROS (+)  Hematology negative hematology ROS (+)   Anesthesia Other Findings   Reproductive/Obstetrics negative OB ROS                          Anesthesia Physical Anesthesia Plan  ASA: III  Anesthesia Plan: Spinal   Post-op Pain Management:    Induction: Intravenous  Airway Management Planned: Simple Face Mask  Additional Equipment:   Intra-op Plan:   Post-operative Plan:   Informed Consent: I have reviewed the patients History and Physical, chart, labs and discussed the procedure including the risks, benefits and alternatives for the proposed anesthesia with the patient or authorized representative who has indicated his/her understanding and acceptance.   Dental advisory given  Plan Discussed with: CRNA and Surgeon  Anesthesia Plan Comments:         Anesthesia Quick Evaluation

## 2013-12-22 NOTE — Op Note (Signed)
OPERATIVE REPORT  PREOPERATIVE DIAGNOSIS: Osteoarthritis of the Right hip.   POSTOPERATIVE DIAGNOSIS: Osteoarthritis of the Right  hip.   PROCEDURE: Right total hip arthroplasty, anterior approach.   SURGEON: Gaynelle Arabian, MD   ASSISTANT: Ardeen Jourdain, PA-C  ANESTHESIA:  Spinal  EBL: 350 ml  DRAINS: Hemovac x1.   COMPLICATIONS: None   CONDITION: PACU - hemodynamically stable.   BRIEF CLINICAL NOTE: Wesley Garrison is a 80 y.o. male who has advanced end-  stage arthritis of his Right  hip with progressively worsening pain and  dysfunction.The patient has failed nonoperative management and presents for  total hip arthroplasty.   PROCEDURE IN DETAIL: After successful administration of spinal  anesthetic, the traction boots for the [facility] bed were placed on both  feet and the patient was placed onto the [facility] bed, boots placed into the leg  holders. The Right hip was then isolated from the perineum with plastic  drapes and prepped and draped in the usual sterile fashion. ASIS and  greater trochanter were marked and a oblique incision was made, starting  at about 1 cm lateral and 2 cm distal to the ASIS and coursing towards  the anterior cortex of the femur. The skin was cut with a 10 blade  through subcutaneous tissue to the level of the fascia overlying the  tensor fascia lata muscle. The fascia was then incised in line with the  incision at the junction of the anterior third and posterior 2/3rd. The  muscle was teased off the fascia and then the interval between the TFL  and the rectus was developed. The Hohmann retractor was then placed at  the top of the femoral neck over the capsule. The vessels overlying the  capsule were cauterized and the fat on top of the capsule was removed.  A Hohmann retractor was then placed anterior underneath the rectus  femoris to give exposure to the entire anterior capsule. A T-shaped  capsulotomy was performed. The edges were  tagged and the femoral head  was identified.       Osteophytes are removed off the superior acetabulum.  The femoral neck was then cut in situ with an oscillating saw. Traction  was then applied to the left lower extremity utilizing the [facility]  traction. The femoral head was then removed. Retractors were placed  around the acetabulum and then circumferential removal of the labrum was  performed. Osteophytes were also removed. Reaming starts at 47 mm to  medialize and  Increased in 2 mm increments to 53 mm. We reamed in  approximately 40 degrees of abduction, 20 degrees anteversion. A 54 mm  pinnacle acetabular shell was then impacted in anatomic position under  fluoroscopic guidance with excellent purchase. We did not need to place  any additional dome screws. A 36 mm neutral + 4 marathon liner was then  placed into the acetabular shell.       The femoral lift was then placed along the lateral aspect of the femur  just distal to the vastus ridge. The leg was  externally rotated and capsule  was stripped off the inferior aspect of the femoral neck down to the  level of the lesser trochanter, this was done with electrocautery. The femur was lifted after this was performed. The  leg was then placed and extended in adducted position to essentially delivering the femur. We also removed the capsule superiorly and the  piriformis from the piriformis fossa to  gain excellent exposure of the  proximal femur. Rongeur was used to remove some cancellous bone to get  into the lateral portion of the proximal femur for placement of the  initial starter reamer. The starter broaches was placed  the starter broach  and was shown to go down the center of the canal. Broaching  with the  Corail system was then performed starting at size 8, coursing  Up to size 11. A size 11 had excellent torsional and rotational  and axial stability. The trial standard offset neck was then placed  with a 36 + 1.5 trial head. The  hip was then reduced. We confirmed that  the stem was in the canal both on AP and lateral x-rays. It also has excellent sizing. The hip was reduced with outstanding stability through full extension, full external rotation,  and then flexion in adduction internal rotation. AP pelvis was taken  and the leg lengths were measured and found to be exactly equal. Hip  was then dislocated again and the femoral head and neck removed. The  femoral broach was removed. Size 11 Corail stem with a standard offset  neck was then impacted into the femur following native anteversion. Has  excellent purchase in the canal. Excellent torsional and rotational and  axial stability. It is confirmed to be in the canal on AP and lateral  fluoroscopic views. The 36 + 1.5 metal head was placed and the hip  reduced with outstanding stability. Again AP pelvis was taken and it  confirmed that the leg lengths were equal. The wound was then copiously  irrigated with saline solution and the capsule reattached and repaired  with Ethibond suture.  20 mL of Exparel mixed with 50 mL of saline then additional 20 ml of .25% Bupivicaine injected into the capsule and into the edge of the tensor fascia lata as well as subcutaneous tissue. The fascia overlying the tensor fascia lata was  then closed with a running #1 V-Loc. Subcu was closed with interrupted  2-0 Vicryl and subcuticular running 4-0 Monocryl. Incision was cleaned  and dried. Steri-Strips and a bulky sterile dressing applied. Hemovac  drain was hooked to suction and then he was awakened and transported to  recovery in stable condition.        Please note that a surgical assistant was a medical necessity for this procedure to perform it in a safe and expeditious manner. Assistant was necessary to provide appropriate retraction of vital neurovascular structures and to prevent femoral fracture and allow for anatomic placement of the prosthesis.  Gaynelle Arabian, M.D.

## 2013-12-22 NOTE — Interval H&P Note (Signed)
History and Physical Interval Note:  12/22/2013 2:44 PM  Wesley Garrison  has presented today for surgery, with the diagnosis of OA OF RIGHT HIP  The various methods of treatment have been discussed with the patient and family. After consideration of risks, benefits and other options for treatment, the patient has consented to  Procedure(s): RIGHT TOTAL HIP ARTHROPLASTY ANTERIOR APPROACH (Right) as a surgical intervention .  The patient's history has been reviewed, patient examined, no change in status, stable for surgery.  I have reviewed the patient's chart and labs.  Questions were answered to the patient's satisfaction.     Gearlean Alf

## 2013-12-22 NOTE — Anesthesia Postprocedure Evaluation (Signed)
  Anesthesia Post-op Note  Patient: Wesley Garrison  Procedure(s) Performed: Procedure(s) (LRB): RIGHT TOTAL HIP ARTHROPLASTY ANTERIOR APPROACH (Right)  Patient Location: PACU  Anesthesia Type: Spinal  Level of Consciousness: awake and alert   Airway and Oxygen Therapy: Patient Spontanous Breathing  Post-op Pain: mild  Post-op Assessment: Post-op Vital signs reviewed, Patient's Cardiovascular Status Stable, Respiratory Function Stable, Patent Airway and No signs of Nausea or vomiting  Last Vitals:  Filed Vitals:   12/22/13 1745  BP: 131/57  Pulse: 57  Temp: 36.6 C  Resp: 17    Post-op Vital Signs: stable   Complications: No apparent anesthesia complications

## 2013-12-22 NOTE — Transfer of Care (Signed)
Immediate Anesthesia Transfer of Care Note  Patient: Wesley Garrison  Procedure(s) Performed: Procedure(s): RIGHT TOTAL HIP ARTHROPLASTY ANTERIOR APPROACH (Right)  Patient Location: PACU  Anesthesia Type:Spinal  Level of Consciousness: awake, patient cooperative, lethargic and responds to stimulation  Airway & Oxygen Therapy: Patient Spontanous Breathing and Patient connected to face mask oxygen  Post-op Assessment: Report given to PACU RN and Post -op Vital signs reviewed and stable  Post vital signs: Reviewed and stable  Complications: No apparent anesthesia complications

## 2013-12-23 ENCOUNTER — Encounter (HOSPITAL_COMMUNITY): Payer: Self-pay | Admitting: Orthopedic Surgery

## 2013-12-23 LAB — BASIC METABOLIC PANEL
Anion gap: 9 (ref 5–15)
BUN: 8 mg/dL (ref 6–23)
CHLORIDE: 101 meq/L (ref 96–112)
CO2: 27 meq/L (ref 19–32)
Calcium: 8.3 mg/dL — ABNORMAL LOW (ref 8.4–10.5)
Creatinine, Ser: 0.59 mg/dL (ref 0.50–1.35)
GFR calc Af Amer: >90 mL/min (ref >90)
GFR calc non Af Amer: 88 mL/min — ABNORMAL LOW (ref >90)
GLUCOSE: 141 mg/dL — AB (ref 70–99)
POTASSIUM: 3.6 meq/L — AB (ref 3.7–5.3)
SODIUM: 137 meq/L (ref 137–147)

## 2013-12-23 LAB — CBC
HCT: 34.8 % — ABNORMAL LOW (ref 39.0–52.0)
HEMOGLOBIN: 11.7 g/dL — AB (ref 13.0–17.0)
MCH: 28.8 pg (ref 26.0–34.0)
MCHC: 33.6 g/dL (ref 30.0–36.0)
MCV: 85.7 fL (ref 78.0–100.0)
Platelets: 139 10*3/uL — ABNORMAL LOW (ref 150–400)
RBC: 4.06 MIL/uL — AB (ref 4.22–5.81)
RDW: 13 % (ref 11.5–15.5)
WBC: 9.2 10*3/uL (ref 4.0–10.5)

## 2013-12-23 LAB — GLUCOSE, CAPILLARY
GLUCOSE-CAPILLARY: 137 mg/dL — AB (ref 70–99)
GLUCOSE-CAPILLARY: 163 mg/dL — AB (ref 70–99)
Glucose-Capillary: 236 mg/dL — ABNORMAL HIGH (ref 70–99)
Glucose-Capillary: 272 mg/dL — ABNORMAL HIGH (ref 70–99)

## 2013-12-23 MED ORDER — RIVAROXABAN 10 MG PO TABS: 10.0000 mg | ORAL_TABLET | Freq: Every day | ORAL | Status: DC

## 2013-12-23 MED ORDER — TRAMADOL HCL 50 MG PO TABS: 50.0000 mg | ORAL_TABLET | Freq: Four times a day (QID) | ORAL | Status: DC | PRN

## 2013-12-23 MED ORDER — OXYCODONE HCL 5 MG PO TABS: 5.0000 mg | ORAL_TABLET | ORAL | Status: DC | PRN

## 2013-12-23 MED ORDER — METHOCARBAMOL 500 MG PO TABS: 500.0000 mg | ORAL_TABLET | Freq: Four times a day (QID) | ORAL | Status: DC | PRN

## 2013-12-23 MED ORDER — DSS 100 MG PO CAPS: 100.0000 mg | ORAL_CAPSULE | Freq: Two times a day (BID) | ORAL | Status: DC

## 2013-12-23 NOTE — Evaluation (Signed)
Occupational Therapy Evaluation Patient Details Name: Wesley Garrison MRN: [id number] DOB: 1924/11/09 Today's Date: 12/23/2013    History of Present Illness pt is s/p R DA THA   Clinical Impression   This 80 year old man was admitted for the above surgery.  He has a h/o bil TKAs which were done in '98.  Pt was mod I with adls prior to admission.  Wife will assist him with ADLs as needed when he goes home.  He will benefit from continued OT in acute to reinforce safety with toilet transfers and standing during adls.  Goals are supervision level and current level is min guard to min A for these two areas.      Follow Up Recommendations  Supervision/Assistance - 24 hour    Equipment Recommendations  3 in 1 bedside comode    Recommendations for Other Services       Precautions / Restrictions Precautions Precautions: Fall Restrictions Weight Bearing Restrictions: No      Mobility Bed Mobility                  Transfers Overall transfer level: Needs assistance Equipment used: Rolling walker (2 wheeled) Transfers: Sit to/from Stand Sit to Stand: Min assist         General transfer comment: steadying assistance:  transitions are quick and not fluid    Balance                                            ADL Overall ADL's : Needs assistance/impaired     Grooming: Min guard;Standing   Upper Body Bathing: Set up;Sitting   Lower Body Bathing: Moderate assistance;Sit to/from stand   Upper Body Dressing : Set up;Sitting   Lower Body Dressing: Maximal assistance;Sit to/from stand   Toilet Transfer: Minimal assistance;Ambulation;BSC   Toileting- Water quality scientist and Hygiene: Min guard;Sit to/from stand         General ADL Comments: wife will assist pt with ADLs as needed.  Ambulated to bathroom.  Pt sequenced correctly on way in but needed cues on the way out.  He had one LOB coming back from bathroom requiring assistance to avoid fall.   Educated on sequence of shower but pt does not feel he can step in backwards--will have HHPT practice with him with his shower.  Pt is very motivated; cued that he needs to move slowly and safely.     Vision                     Perception     Praxis      Pertinent Vitals/Pain Pain Assessment: 0-10 Pain Score: 4  Pain Location: R hip Pain Descriptors / Indicators: Sore Pain Intervention(s): Repositioned;Ice applied     Hand Dominance     Extremity/Trunk Assessment Upper Extremity Assessment Upper Extremity Assessment: Overall WFL for tasks assessed           Communication Communication Communication: HOH   Cognition Arousal/Alertness: Awake/alert Behavior During Therapy: WFL for tasks assessed/performed Overall Cognitive Status: Within Functional Limits for tasks assessed (needs reinforcement with safety)                     General Comments       Exercises       Shoulder Instructions      Home Living Family/patient expects to be  discharged to:: Private residence Living Arrangements: Spouse/significant other                 Bathroom Shower/Tub: Occupational psychologist: Standard         Additional Comments: needs 3:1--had borrowed this before      Prior Functioning/Environment Level of Independence: Independent with assistive device(s)             OT Diagnosis: Generalized weakness   OT Problem List: Decreased activity tolerance;Impaired balance (sitting and/or standing);Pain;Decreased safety awareness   OT Treatment/Interventions: Self-care/ADL training;Balance training;Patient/family education    OT Goals(Current goals can be found in the care plan section) Acute Rehab OT Goals OT Goal Formulation: With patient Time For Goal Achievement: 12/30/13 Potential to Achieve Goals: Good ADL Goals Pt Will Perform Grooming: with supervision;standing Pt Will Transfer to Toilet: with supervision;ambulating;bedside  commode Pt Will Perform Toileting - Clothing Manipulation and hygiene: with supervision;sit to/from stand  OT Frequency: Min 2X/week   Barriers to D/C:            Co-evaluation              End of Session    Activity Tolerance: Patient tolerated treatment well Patient left: in chair;with call bell/phone within reach;with family/visitor present   Time: 3244-0102 OT Time Calculation (min): 26 min Charges:  OT General Charges $OT Visit: 1 Procedure OT Evaluation $Initial OT Evaluation Tier I: 1 Procedure OT Treatments $Self Care/Home Management : 8-22 mins G-Codes:    Cait Locust 01-22-2014, 11:13 AM  Lesle Chris, OTR/L [phone] Jan 22, 2014

## 2013-12-23 NOTE — Progress Notes (Signed)
   Subjective: 1 Day Post-Op Procedure(s) (LRB): RIGHT TOTAL HIP ARTHROPLASTY ANTERIOR APPROACH (Right) Patient reports pain as mild.   Patient seen in rounds with Dr. Wynelle Link. Patient is well, and has had no acute complaints or problems. No issues overnight. No SOB or chest pain.  We will start therapy today.  Plan is to go Home after hospital stay.  Objective: Vital signs in last 24 hours: Temp:  [97.2 F (36.2 C)-98.7 F (37.1 C)] 98.2 F (36.8 C) (08/21 0532) Pulse Rate:  [53-75] 67 (08/21 0532) Resp:  [16-20] 18 (08/21 0532) BP: (116-193)/(57-88) 140/58 mmHg (08/21 0532) SpO2:  [97 %-100 %] 100 % (08/21 0532) Weight:  [81.647 kg (180 lb)] 81.647 kg (180 lb) (08/20 1840)  Intake/Output from previous day:  Intake/Output Summary (Last 24 hours) at 12/23/13 0802 Last data filed at 12/23/13 0533  Gross per 24 hour  Intake   2590 ml  Output   2565 ml  Net     25 ml     Labs:  Recent Labs  12/23/13 0430  HGB 11.7*    Recent Labs  12/23/13 0430  WBC 9.2  RBC 4.06*  HCT 34.8*  PLT 139*    Recent Labs  12/23/13 0430  NA 137  K 3.6*  CL 101  CO2 27  BUN 8  CREATININE 0.59  GLUCOSE 141*  CALCIUM 8.3*    EXAM General - Patient is Alert and Oriented Extremity - Neurologically intact Intact pulses distally Dorsiflexion/Plantar flexion intact Compartment soft Dressing - dressing C/D/I Motor Function - intact, moving foot and toes well on exam.  Hemovac pulled without difficulty.  Past Medical History  Diagnosis Date  . Hypertension   . Gout   . DM type 2 (diabetes mellitus, type 2)     for at least 10 years  . History of kidney stones   . Arthritis   . HOH (hard of hearing)   . GERD (gastroesophageal reflux disease)     Assessment/Plan: 1 Day Post-Op Procedure(s) (LRB): RIGHT TOTAL HIP ARTHROPLASTY ANTERIOR APPROACH (Right) Principal Problem:   OA (osteoarthritis) of hip  Estimated body mass index is 26.57 kg/(m^2) as calculated from the  following:   Height as of this encounter: 5\' 9"  (1.753 m).   Weight as of this encounter: 81.647 kg (180 lb). Advance diet Up with therapy D/C IV fluids when tolerating POs well Discharge home with home health over the weekend  DVT Prophylaxis - Xarelto Weight Bearing As Tolerated  Hemovac Pulled Begin Therapy Hip Precautions  Doing well. Therapy today. DC home over the weekend pending progress.   Ardeen Jourdain, PA-C Orthopaedic Surgery 12/23/2013, 8:02 AM

## 2013-12-23 NOTE — Progress Notes (Signed)
Physical Therapy Treatment Note   12/23/13 1546  PT Visit Information  Last PT Received On 12/23/13  Assistance Needed +1  History of Present Illness Pt is an 80 year old s/p R Direct Anterior THA  PT Time Calculation  PT Start Time 1443  PT Stop Time 1458  PT Time Calculation (min) 15 min  Subjective Data  Subjective Pt ambulated in hallway again this afternoon, with increased L LE buckling observed this afternoon.  Pt also reports numbness in R anterior hip, and entire thigh to knee.  Precautions  Precautions Fall  Precaution Comments direct anterior approach - no precautions  Pain Assessment  Pain Assessment 0-10  Pain Score 3  Pain Location R hip  Pain Descriptors / Indicators Sharp  Pain Intervention(s) Limited activity within patient's tolerance;Repositioned  Cognition  Arousal/Alertness Awake/alert  Behavior During Therapy WFL for tasks assessed/performed  Overall Cognitive Status Within Functional Limits for tasks assessed  Transfers  Overall transfer level Needs assistance  Equipment used Rolling walker (2 wheeled)  Transfers Sit to/from Stand  Sit to Stand Min guard  General transfer comment verbal cues for technique and safety  Ambulation/Gait  Ambulation/Gait assistance Min assist  Ambulation Distance (Feet) 120 Feet  Assistive device Rolling walker (2 wheeled)  Gait Pattern/deviations Step-to pattern;Antalgic;Trunk flexed;Decreased stance time - right  General Gait Details verbal cues for RW distance, occasional L LE buckling requiring assist for safety.  PT - End of Session  Equipment Utilized During Treatment Gait belt  Activity Tolerance Patient tolerated treatment well  Patient left in chair;with call bell/phone within reach;with family/visitor present  PT - Assessment/Plan  PT Plan Current plan remains appropriate  PT Frequency 7X/week  Follow Up Recommendations Home health PT;Supervision for mobility/OOB  PT equipment Rolling walker with 5" wheels  PT  Goal Progression  Progress towards PT goals Progressing toward goals  PT General Charges  $$ ACUTE PT VISIT 1 Procedure  PT Treatments  $Gait Training 8-22 mins   Carmelia Bake, PT, DPT 12/23/2013 Pager: [phone]

## 2013-12-23 NOTE — Discharge Instructions (Signed)
Information on my medicine - XARELTO (Rivaroxaban)  This medication education was reviewed with me or my healthcare representative as part of my discharge preparation.  The pharmacist that spoke with me during my hospital stay was:  Emiliano Dyer, RPH  Why was Xarelto prescribed for you? Xarelto was prescribed for you to reduce the risk of blood clots forming after orthopedic surgery. The medical term for these abnormal blood clots is venous thromboembolism (VTE).  What do you need to know about xarelto ? Take your Xarelto ONCE DAILY at the same time every day. You may take it either with or without food.  If you have difficulty swallowing the tablet whole, you may crush it and mix in applesauce just prior to taking your dose.  Take Xarelto exactly as prescribed by your doctor and DO NOT stop taking Xarelto without talking to the doctor who prescribed the medication.  Stopping without other VTE prevention medication to take the place of Xarelto may increase your risk of developing a clot.  After discharge, you should have regular check-up appointments with your healthcare provider that is prescribing your Xarelto.    What do you do if you miss a dose? If you miss a dose, take it as soon as you remember on the same day then continue your regularly scheduled once daily regimen the next day. Do not take two doses of Xarelto on the same day.   Important Safety Information A possible side effect of Xarelto is bleeding. You should call your healthcare provider right away if you experience any of the following:   Bleeding from an injury or your nose that does not stop.   Unusual colored urine (red or dark brown) or unusual colored stools (red or black).   Unusual bruising for unknown reasons.   A serious fall or if you hit your head (even if there is no bleeding).  Some medicines may interact with Xarelto and might increase your risk of bleeding while on Xarelto. To help avoid  this, consult your healthcare provider or pharmacist prior to using any new prescription or non-prescription medications, including herbals, vitamins, non-steroidal anti-inflammatory drugs (NSAIDs) and supplements.  This website has more information on Xarelto: [email or web address]

## 2013-12-23 NOTE — Progress Notes (Signed)
[facility]  [facility] is providing the following services: RW and Commode  If patient discharges after hours, please call [phone].   Linward Headland 12/23/2013, 2:28 PM

## 2013-12-23 NOTE — Evaluation (Signed)
Physical Therapy Evaluation Patient Details Name: Wesley Garrison MRN: [id number] DOB: 06-Sep-1924 Today's Date: 12/23/2013   History of Present Illness  Pt is an 79 year old s/p R Direct Anterior THA  Clinical Impression  Pt is s/p R THA resulting in the deficits listed below (see PT Problem List).  Pt will benefit from skilled PT to increase their independence and safety with mobility to allow discharge to the venue listed below.  Pt requiring increased verbal cues for safety today however mobilizing with min assist.  Pt plans to d/c home with spouse.  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  Recommend supervision for safety upon d/c.     Follow Up Recommendations Home health PT;Supervision for mobility/OOB    Equipment Recommendations       Recommendations for Other Services       Precautions / Restrictions Precautions Precautions: Fall Precaution Comments: direct anterior approach - no precautions Restrictions Weight Bearing Restrictions: No      Mobility  Bed Mobility Overal bed mobility: Needs Assistance Bed Mobility: Supine to Sit     Supine to sit: Min assist     General bed mobility comments: verbal cues for technique, assist for R LE  Transfers Overall transfer level: Needs assistance Equipment used: Rolling walker (2 wheeled) Transfers: Sit to/from Stand Sit to Stand: Min assist         General transfer comment: verbal cues for technique and safety, pt tends to mobilize quickly so discussed slower actions for safety  Ambulation/Gait Ambulation/Gait assistance: Min assist Ambulation Distance (Feet): 60 Feet Assistive device: Rolling walker (2 wheeled) Gait Pattern/deviations: Step-to pattern;Antalgic;Decreased stance time - right;Decreased step length - right     General Gait Details: verbal cues for sequence, RW positioning, step length, posture  Stairs            Wheelchair Mobility    Modified Rankin (Stroke Patients Only)       Balance                                             Pertinent Vitals/Pain Pain Assessment: 0-10 Pain Score: 5  Pain Location: R hip Pain Descriptors / Indicators: Aching;Sore Pain Intervention(s): Limited activity within patient's tolerance;Premedicated before session;Repositioned;Patient requesting pain meds-RN notified    Home Living Family/patient expects to be discharged to:: Private residence Living Arrangements: Spouse/significant other   Type of Home: House Home Access: [place]: One [place]: [place] - single point Additional Comments: needs 3:1--had borrowed this before    Prior Function Level of Independence: Independent with assistive device(s)               Hand Dominance        Extremity/Trunk Assessment   Upper Extremity Assessment: Overall WFL for tasks assessed           Lower Extremity Assessment: RLE deficits/detail RLE Deficits / Details: functional hip weakness observed, decreased AROM against gravity       Communication   Communication: HOH  Cognition Arousal/Alertness: Awake/alert Behavior During Therapy: WFL for tasks assessed/performed Overall Cognitive Status: Within Functional Limits for tasks assessed (needs reinforcement with safety)  General Comments      Exercises Total Joint Exercises Ankle Circles/Pumps: AROM;Both;15 reps Quad Sets: 15 reps;Both;AROM Gluteal Sets: AROM;Both;15 reps Towel Squeeze: AROM;Both;15 reps Short Arc QuadSinclair Ship;Right;15 reps Heel Slides: AAROM;Right;15 reps Hip ABduction/ADduction: AAROM;Right;15 reps Straight Leg Raises: AAROM;Right;10 reps      Assessment/Plan    PT Assessment Patient needs continued PT services  PT Diagnosis Difficulty walking;Acute pain   PT Problem List Decreased strength;Decreased activity  tolerance;Decreased mobility;Decreased range of motion;Decreased knowledge of use of DME;Decreased safety awareness;Pain  PT Treatment Interventions Functional mobility training;Patient/family education;Gait training;DME instruction;Therapeutic exercise;Therapeutic activities   PT Goals (Current goals can be found in the Care Plan section) Acute Rehab PT Goals PT Goal Formulation: With patient Time For Goal Achievement: 12/30/13 Potential to Achieve Goals: Good    Frequency 7X/week   Barriers to discharge        Co-evaluation               End of Session Equipment Utilized During Treatment: Gait belt Activity Tolerance: Patient tolerated treatment well Patient left: in chair;with call bell/phone within reach;with family/visitor present           Time: 3299-2426 PT Time Calculation (min): 26 min   Charges:   PT Evaluation $Initial PT Evaluation Tier I: 1 Procedure PT Treatments $Gait Training: 8-22 mins $Therapeutic Exercise: 8-22 mins   PT G Codes:          Mahlani Berninger,KATHrine E 12/23/2013, 1:52 PM Carmelia Bake, PT, DPT 12/23/2013 Pager: [phone]

## 2013-12-23 NOTE — Progress Notes (Signed)
CARE MANAGEMENT NOTE 12/23/2013  Patient:  Wesley Garrison, Wesley Garrison   Account Number:  [id number]  Date Initiated:  12/23/2013  Documentation initiated by:  Dessa Phi  Subjective/Objective Assessment:   80 Y/O M ADMITTED W/OA R HIP.     Action/Plan:   FROM HOME.   Anticipated DC Date:  12/26/2013   Anticipated DC Plan:  [place]  CM consult      [facility] Choice  HOME HEALTH   Choice offered to / List presented to:  C-1 Patient   DME arranged  [facility]  3-N-1      DME agency  [facility] arranged  [place]   Status of service:  In process, will continue to follow Medicare Important Message given?   (If response is "NO", the following Medicare IM given date fields will be blank) Date Medicare IM given:   Medicare IM given by:   Date Additional Medicare IM given:   Additional Medicare IM given by:    Discharge Disposition:    Per UR Regulation:  Reviewed for med. necessity/level of care/duration of stay  If discussed at [place] of Stay Meetings, dates discussed:    Comments:  12/23/13 Wesley Atlas RN,BSN NCM [phone] POD#1 R THA.PT-HH.[facility] REP Wesley Garrison ALREADY FOLLOWING FOR HHPT.HHPT ALREADY ORDERED,HOME RW,3N1 ALREADY ORDERED-[facility] DME REP FOLLOWING.

## 2013-12-24 LAB — BASIC METABOLIC PANEL
ANION GAP: 11 (ref 5–15)
BUN: 11 mg/dL (ref 6–23)
CO2: 26 meq/L (ref 19–32)
Calcium: 8.8 mg/dL (ref 8.4–10.5)
Chloride: 104 mEq/L (ref 96–112)
Creatinine, Ser: 0.61 mg/dL (ref 0.50–1.35)
GFR calc Af Amer: >90 mL/min (ref >90)
GFR, EST NON AFRICAN AMERICAN: 87 mL/min — AB (ref >90)
GLUCOSE: 181 mg/dL — AB (ref 70–99)
POTASSIUM: 4.2 meq/L (ref 3.7–5.3)
Sodium: 141 mEq/L (ref 137–147)

## 2013-12-24 LAB — CBC
HCT: 35.5 % — ABNORMAL LOW (ref 39.0–52.0)
Hemoglobin: 12 g/dL — ABNORMAL LOW (ref 13.0–17.0)
MCH: 28.6 pg (ref 26.0–34.0)
MCHC: 33.8 g/dL (ref 30.0–36.0)
MCV: 84.7 fL (ref 78.0–100.0)
PLATELETS: 161 10*3/uL (ref 150–400)
RBC: 4.19 MIL/uL — AB (ref 4.22–5.81)
RDW: 13 % (ref 11.5–15.5)
WBC: 13.8 10*3/uL — ABNORMAL HIGH (ref 4.0–10.5)

## 2013-12-24 LAB — GLUCOSE, CAPILLARY: Glucose-Capillary: 162 mg/dL — ABNORMAL HIGH (ref 70–99)

## 2013-12-24 NOTE — Progress Notes (Signed)
   Subjective: 2 Days Post-Op Procedure(s) (LRB): RIGHT TOTAL HIP ARTHROPLASTY ANTERIOR APPROACH (Right)  Minimal pain to right hip States he is getting around pretty well Ready for d/c home Patient reports pain as mild.  Objective:   VITALS:   Filed Vitals:   12/24/13 0626  BP: 164/78  Pulse: 69  Temp: 98.4 F (36.9 C)  Resp: 16    Right hip incision healing well nv intact distally No rashes or edema  LABS  Recent Labs  12/23/13 0430 12/24/13 0500  HGB 11.7* 12.0*  HCT 34.8* 35.5*  WBC 9.2 13.8*  PLT 139* 161     Recent Labs  12/23/13 0430 12/24/13 0500  NA 137 141  K 3.6* 4.2  BUN 8 11  CREATININE 0.59 0.61  GLUCOSE 141* 181*     Assessment/Plan: 2 Days Post-Op Procedure(s) (LRB): RIGHT TOTAL HIP ARTHROPLASTY ANTERIOR APPROACH (Right)  D/c home today F/u in 2 weeks  Pain control as needed Discharge home with home health   Merla Riches, [place], PA-C  12/24/2013, 7:27 AM

## 2013-12-24 NOTE — Plan of Care (Signed)
Problem: Phase III Progression Outcomes Goal: Anticoagulant follow-up in place Outcome: Not Applicable Date Met:  50/51/83 Xarelto VTE, no f/u needed.

## 2013-12-24 NOTE — Discharge Summary (Signed)
Physician Discharge Summary   Patient ID: Wesley Garrison MRN: [id number] DOB/AGE: 05-31-1924 80 y.o.  Admit date: 12/22/2013 Discharge date: 12/24/2013  Admission Diagnoses:  Principal Problem:   OA (osteoarthritis) of hip   Discharge Diagnoses:  Same   Surgeries: Procedure(s): RIGHT TOTAL HIP ARTHROPLASTY ANTERIOR APPROACH on 12/22/2013   Consultants: Pt/OT  Discharged Condition: Stable  Hospital Course: Wesley Garrison is an 80 y.o. male who was admitted 12/22/2013 with a chief complaint of No chief complaint on file. , and found to have a diagnosis of OA (osteoarthritis) of hip.  They were brought to the operating room on 12/22/2013 and underwent the above named procedures.    The patient had an uncomplicated hospital course and was stable for discharge.  Recent vital signs:  Filed Vitals:   12/24/13 0626  BP: 164/78  Pulse: 69  Temp: 98.4 F (36.9 C)  Resp: 16    Recent laboratory studies:  Results for orders placed during the hospital encounter of 12/22/13  GLUCOSE, CAPILLARY      Result Value Ref Range   Glucose-Capillary 120 (*) 70 - 99 mg/dL  GLUCOSE, CAPILLARY      Result Value Ref Range   Glucose-Capillary 112 (*) 70 - 99 mg/dL  CBC      Result Value Ref Range   WBC 9.2  4.0 - 10.5 K/uL   RBC 4.06 (*) 4.22 - 5.81 MIL/uL   Hemoglobin 11.7 (*) 13.0 - 17.0 g/dL   HCT 34.8 (*) 39.0 - 52.0 %   MCV 85.7  78.0 - 100.0 fL   MCH 28.8  26.0 - 34.0 pg   MCHC 33.6  30.0 - 36.0 g/dL   RDW 13.0  11.5 - 15.5 %   Platelets 139 (*) 150 - 400 K/uL  BASIC METABOLIC PANEL      Result Value Ref Range   Sodium 137  137 - 147 mEq/L   Potassium 3.6 (*) 3.7 - 5.3 mEq/L   Chloride 101  96 - 112 mEq/L   CO2 27  19 - 32 mEq/L   Glucose, Bld 141 (*) 70 - 99 mg/dL   BUN 8  6 - 23 mg/dL   Creatinine, Ser 0.59  0.50 - 1.35 mg/dL   Calcium 8.3 (*) 8.4 - 10.5 mg/dL   GFR calc non Af Amer 88 (*) >90 mL/min   GFR calc Af Amer >90  >90 mL/min   Anion gap 9  5 - 15  GLUCOSE,  CAPILLARY      Result Value Ref Range   Glucose-Capillary 140 (*) 70 - 99 mg/dL  GLUCOSE, CAPILLARY      Result Value Ref Range   Glucose-Capillary 137 (*) 70 - 99 mg/dL  GLUCOSE, CAPILLARY      Result Value Ref Range   Glucose-Capillary 163 (*) 70 - 99 mg/dL  GLUCOSE, CAPILLARY      Result Value Ref Range   Glucose-Capillary 236 (*) 70 - 99 mg/dL  CBC      Result Value Ref Range   WBC 13.8 (*) 4.0 - 10.5 K/uL   RBC 4.19 (*) 4.22 - 5.81 MIL/uL   Hemoglobin 12.0 (*) 13.0 - 17.0 g/dL   HCT 35.5 (*) 39.0 - 52.0 %   MCV 84.7  78.0 - 100.0 fL   MCH 28.6  26.0 - 34.0 pg   MCHC 33.8  30.0 - 36.0 g/dL   RDW 13.0  11.5 - 15.5 %   Platelets 161  150 - 400 K/uL  BASIC METABOLIC PANEL      Result Value Ref Range   Sodium 141  137 - 147 mEq/L   Potassium 4.2  3.7 - 5.3 mEq/L   Chloride 104  96 - 112 mEq/L   CO2 26  19 - 32 mEq/L   Glucose, Bld 181 (*) 70 - 99 mg/dL   BUN 11  6 - 23 mg/dL   Creatinine, Ser 0.61  0.50 - 1.35 mg/dL   Calcium 8.8  8.4 - 10.5 mg/dL   GFR calc non Af Amer 87 (*) >90 mL/min   GFR calc Af Amer >90  >90 mL/min   Anion gap 11  5 - 15  GLUCOSE, CAPILLARY      Result Value Ref Range   Glucose-Capillary 272 (*) 70 - 99 mg/dL   Comment 1 Notify RN    GLUCOSE, CAPILLARY      Result Value Ref Range   Glucose-Capillary 162 (*) 70 - 99 mg/dL    Discharge Medications:     Medication List    STOP taking these medications       aspirin EC 81 MG tablet     multivitamin with minerals Tabs tablet     oxyCODONE-acetaminophen 5-325 MG per tablet  Commonly known as:  PERCOCET/ROXICET      TAKE these medications       atorvastatin 10 MG tablet  Commonly known as:  LIPITOR  Take 10 mg by mouth every evening.     DSS 100 MG Caps  Take 100 mg by mouth 2 (two) times daily.     fluticasone 50 MCG/ACT nasal spray  Commonly known as:  FLONASE  Place 2 sprays into the nose daily as needed for allergies.     furosemide 20 MG tablet  Commonly known as:  LASIX    Take 20 mg by mouth daily as needed for fluid.     glipiZIDE 5 MG tablet  Commonly known as:  GLUCOTROL  Take 5-10 mg by mouth 2 (two) times daily before a meal. He takes two tablets in the morning and one tablet at night.     lisinopril 20 MG tablet  Commonly known as:  PRINIVIL,ZESTRIL  Take 20 mg by mouth 2 (two) times daily.     metFORMIN 500 MG tablet  Commonly known as:  GLUCOPHAGE  Take 500 mg by mouth daily with breakfast.     methocarbamol 500 MG tablet  Commonly known as:  ROBAXIN  Take 1 tablet (500 mg total) by mouth every 6 (six) hours as needed for muscle spasms.     metoprolol 50 MG tablet  Commonly known as:  LOPRESSOR  Take 50 mg by mouth 2 (two) times daily.     oxyCODONE 5 MG immediate release tablet  Commonly known as:  Oxy IR/ROXICODONE  Take 1-2 tablets (5-10 mg total) by mouth every 3 (three) hours as needed for breakthrough pain.     pantoprazole 40 MG tablet  Commonly known as:  PROTONIX  Take 40 mg by mouth every morning.     rivaroxaban 10 MG Tabs tablet  Commonly known as:  XARELTO  Take 1 tablet (10 mg total) by mouth daily with breakfast.     tamsulosin 0.4 MG Caps capsule  Commonly known as:  FLOMAX  Take 0.4 mg by mouth 2 (two) times daily.     traMADol 50 MG tablet  Commonly known as:  ULTRAM  Take 1-2 tablets (50-100 mg total) by mouth every 6 (six)  hours as needed for moderate pain.        Diagnostic Studies: Dg Chest 2 View  12/14/2013   CLINICAL DATA:  Right hip surgery  EXAM: CHEST  2 VIEW  COMPARISON:  None.  FINDINGS: The heart size and mediastinal contours are within normal limits. Both lungs are clear. The visualized skeletal structures are unremarkable.  IMPRESSION: No active cardiopulmonary disease.   Electronically Signed   By: Kathreen Devoid   On: 12/14/2013 16:14   Dg Hip Complete Right  12/14/2013   CLINICAL DATA:  Preop for hip surgery.  EXAM: RIGHT HIP - COMPLETE 2+ VIEW  COMPARISON:  None.  FINDINGS: There is marked  axial hip joint space narrowing on the right with osteophytes along the base of the right femoral head and from the superior right acetabulum. Left hip joint space is well preserved.  No fracture. No bone lesion. SI joints and pubic symphysis are well aligned.  Soft tissues are unremarkable.  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 No fracture or acute finding.   Electronically Signed   By: Lajean Manes M.D.   On: 12/14/2013 15:59   Dg Pelvis Portable  12/22/2013   CLINICAL DATA:  Post RIGHT total hip replacement  EXAM: PORTABLE PELVIS 1-2 VIEWS  COMPARISON:  Portable exam 1730 hr without priors for comparison  FINDINGS: Components of RIGHT hip prosthesis identified.  No acute fracture or dislocation.  Surgical drain and expected soft tissue changes from surgery.  No acute osseous findings identified.  IMPRESSION: RIGHT hip prosthesis without acute abnormalities.   Electronically Signed   By: Lavonia Dana M.D.   On: 12/22/2013 18:04   Dg C-arm 1-60 Min-no Report  12/22/2013   CLINICAL DATA: anterior hip replacement   C-ARM 1-60 MINUTES  Fluoroscopy was utilized by the requesting physician.  No radiographic  interpretation.     Disposition: 01-Home or Self Care      Discharge Instructions   Call MD / Call 911    Complete by:  As directed   If you experience chest pain or shortness of breath, CALL 911 and be transported to the hospital emergency room.  If you develope a fever above 101 F, pus (white drainage) or increased drainage or redness at the wound, or calf pain, call your surgeon's office.     Change dressing    Complete by:  As directed   You may change your dressing daily with sterile 4 x 4 inch gauze dressing and paper tape.     Constipation Prevention    Complete by:  As directed   Drink plenty of fluids.  Prune juice may be helpful.  You may use a stool softener, such as Colace (over the counter) 100 mg twice a day.  Use MiraLax (over the counter) for constipation  as needed.     Diet Carb Modified    Complete by:  As directed      Driving restrictions    Complete by:  As directed   No driving     Follow the hip precautions as taught in Physical Therapy    Complete by:  As directed      Increase activity slowly as tolerated    Complete by:  As directed      TED hose    Complete by:  As directed   Use stockings (TED hose) for 3 weeks on both leg(s).  You may remove them at night for sleeping.  Follow-up Information   Follow up with Gearlean Alf, MD. Schedule an appointment as soon as possible for a visit on 01/05/2014.   Specialty:  Orthopedic Surgery   Contact information:   [address] [place] [address] [phone]        Signed: Ventura Bruns 12/24/2013, 7:28 AM

## 2013-12-24 NOTE — Progress Notes (Signed)
Discharged from floor via w/c, wife with pt. No changes in assessment. Junette Bernat  

## 2013-12-24 NOTE — Progress Notes (Signed)
Occupational Therapy Treatment Patient Details Name: Wesley Garrison MRN: [id number] DOB: 1925-02-09 Today's Date: 12/24/2013    History of present illness Pt is an 80 year old s/p R Direct Anterior THA   OT comments  Pt doing well this day and ready to go home  Follow Up Recommendations  Supervision/Assistance - 24 hour;Home health OT    Equipment Recommendations  3 in 1 bedside comode       Precautions / Restrictions Precautions Precautions: Fall Precaution Comments: direct anterior approach - no precautions Restrictions Weight Bearing Restrictions: No       Mobility Bed Mobility Overal bed mobility: Modified Independent Bed Mobility: Supine to Sit     Supine to sit: HOB elevated     General bed mobility comments: self assisted RLE with BUEs  Transfers Overall transfer level: Needs assistance Equipment used: Rolling walker (2 wheeled) Transfers: Sit to/from Stand Sit to Stand: Supervision         General transfer comment: verbal cues for hand placement        ADL Overall ADL's : Needs assistance/impaired Eating/Feeding: Supervision/ safety;With assist to don/doff brace/orthosis   Grooming: Standing;Supervision/safety               Lower Body Dressing: Sit to/from stand;Minimal assistance   Toilet Transfer: Minimal assistance;Ambulation;BSC   Toileting- Water quality scientist and Hygiene: Min guard;Sit to/from stand         General ADL Comments: pt improved this OT visit                Cognition   Behavior During Therapy: WFL for tasks assessed/performed Overall Cognitive Status: Within Functional Limits for tasks assessed                         Exercises Total Joint Exercises Ankle Circles/Pumps: AROM;Both;15 reps Quad Sets: 15 reps;Both;AROM Heel Slides: AAROM;Right;15 reps Hip ABduction/ADduction: AAROM;Right;15 reps Long Arc Quad: AAROM;Right;10 reps;Seated           Pertinent Vitals/ Pain       Pain Score:  2  Pain Location: R hip Pain Descriptors / Indicators: Aching Pain Intervention(s): Repositioned;Ice applied     Prior Functioning/Environment              Frequency Min 2X/week     Progress Toward Goals  OT Goals(current goals can now be found in the care plan section)  Progress towards OT goals: Progressing toward goals     Plan Discharge plan remains appropriate       End of Session     Activity Tolerance Patient tolerated treatment well   Patient Left in chair;with call bell/phone within reach;with family/visitor present   Nurse Communication Mobility status        Time: 6659-9357 OT Time Calculation (min): 17 min  Charges: OT General Charges $OT Visit: 1 Procedure OT Treatments $Self Care/Home Management : 8-22 mins  Amish Mintzer, Thereasa Parkin 12/24/2013, 10:38 AM

## 2013-12-24 NOTE — Progress Notes (Addendum)
Physical Therapy Treatment Patient Details Name: Wesley Garrison MRN: [id number] DOB: 1924/12/04 Today's Date: 12/24/2013    History of Present Illness Pt is an 80 year old s/p R Direct Anterior THA    PT Comments    **Good progress with mobility, he walked 350' with RW independently, RLE buckled 1x but pt stated this is long standing issue. R quad weakness noted, pt unable to extend knee in seated position without assistance. REviewed home exercises, handout issued. He is ready to DC home from PT standpoint.  *  Follow Up Recommendations  Home health PT;Supervision for mobility/OOB     Equipment Recommendations  Rolling walker with 5" wheels    Recommendations for Other Services       Precautions / Restrictions Precautions Precautions: Fall Precaution Comments: direct anterior approach - no precautions Restrictions Weight Bearing Restrictions: No    Mobility  Bed Mobility Overal bed mobility: Modified Independent Bed Mobility: Supine to Sit     Supine to sit: Modified independent (Device/Increase time);HOB elevated     General bed mobility comments: self assisted RLE with BUEs  Transfers Overall transfer level: Needs assistance Equipment used: Rolling walker (2 wheeled) Transfers: Sit to/from Stand Sit to Stand: Supervision         General transfer comment: verbal cues for hand placement  Ambulation/Gait Ambulation/Gait assistance: Modified independent (Device/Increase time) Ambulation Distance (Feet): 350 Feet Assistive device: Rolling walker (2 wheeled) Gait Pattern/deviations: Step-to pattern;Trunk flexed;Decreased stance time - right     General Gait Details: verbal cues for posture, RLE buckled 1x but pt was able to self correct without assistance, he stated buckling has been occurring ever since a pre-existing softball injury to R knee    Stairs            Wheelchair Mobility    Modified Rankin (Stroke Patients Only)       Balance                                     Cognition Arousal/Alertness: Awake/alert Behavior During Therapy: WFL for tasks assessed/performed Overall Cognitive Status: Within Functional Limits for tasks assessed                      Exercises Total Joint Exercises Ankle Circles/Pumps: AROM;Both;15 reps Quad Sets: 15 reps;Both;AROM Heel Slides: AAROM;Right;15 reps Hip ABduction/ADduction: AAROM;Right;15 reps Long Arc Quad: AAROM;Right;10 reps;Seated    General Comments        Pertinent Vitals/Pain Pain Score: 4  Pain Location: R hip and thigh Pain Descriptors / Indicators: Sharp Pain Intervention(s): Premedicated before session;Ice applied;Monitored during session    Home Living                      Prior Function            PT Goals (current goals can now be found in the care plan section) Acute Rehab PT Goals PT Goal Formulation: With patient/family Time For Goal Achievement: 12/30/13 Potential to Achieve Goals: Good Progress towards PT goals: Progressing toward goals    Frequency  7X/week    PT Plan Current plan remains appropriate    Co-evaluation             End of Session Equipment Utilized During Treatment: Gait belt Activity Tolerance: Patient tolerated treatment well Patient left: in chair;with call bell/phone within reach;with family/visitor present  Time: 9381-0175 PT Time Calculation (min): 25 min  Charges:  $Gait Training: 8-22 mins $Therapeutic Exercise: 8-22 mins                    G Codes:      Philomena Doheny 12/24/2013, 9:00 AM [phone]

## 2014-01-28 ENCOUNTER — Other Ambulatory Visit: Payer: Self-pay | Admitting: Cardiovascular Disease

## 2014-02-21 ENCOUNTER — Ambulatory Visit (INDEPENDENT_AMBULATORY_CARE_PROVIDER_SITE_OTHER): Payer: Medicare Other | Admitting: Cardiovascular Disease

## 2014-02-21 ENCOUNTER — Encounter: Payer: Self-pay | Admitting: Cardiovascular Disease

## 2014-02-21 VITALS — BP 175/76 | HR 60 | Ht 69.0 in | Wt 176.5 lb

## 2014-02-21 DIAGNOSIS — I2510 Atherosclerotic heart disease of native coronary artery without angina pectoris: Secondary | ICD-10-CM

## 2014-02-21 DIAGNOSIS — I340 Nonrheumatic mitral (valve) insufficiency: Secondary | ICD-10-CM

## 2014-02-21 DIAGNOSIS — I10 Essential (primary) hypertension: Secondary | ICD-10-CM

## 2014-02-21 DIAGNOSIS — E785 Hyperlipidemia, unspecified: Secondary | ICD-10-CM

## 2014-02-21 MED ORDER — AMLODIPINE BESYLATE 5 MG PO TABS: 5.0000 mg | ORAL_TABLET | Freq: Every day | ORAL | Status: DC

## 2014-02-21 NOTE — Patient Instructions (Signed)
   Begin Amlodipine 5mg  daily - 90 day supply + 3 refills sent to [facility] Continue all other medications.   Your physician wants you to follow up in: 6 months.  You will receive a reminder letter in the mail one-two months in advance.  If you don't receive a letter, please call our office to schedule the follow up appointment

## 2014-02-21 NOTE — Progress Notes (Signed)
Patient ID: Wesley Garrison, male   DOB: 1924-08-29, 80 y.o.   MRN: [id number]      SUBJECTIVE: Wesley Garrison is an 80 year old male who is here for routine follow up for CAD and mitral regurgitation. He has a history of type 2 diabetes and hypertension. He had an echocardiogram in the past done for dyspnea which showed normal LV systolic function, moderate mitral regurgitation, mild aortic regurgitation and mild pulmonary hypertension. He had a CT of the chest which showed atherosclerosis and calcifications of the RCA and LAD.  He previously had a pharmacologic nuclear stress test which showed mild partially reversible anteroseptal and inferior defect with normal EF, with no high risk features.   The patient denies any symptoms of chest pain, palpitations, shortness of breath, lightheadedness, dizziness, leg swelling, orthopnea, PND, and syncope. He successfully underwent right total hip arthroplasty in August without complications, specifically no cardiac complications. However, he has problems with both of his knees and was told he likely needs total knee replacement of both of them. He denies chest pain, shortness of breath, lightheadedness, dizziness and leg swelling.   Review of Systems: As per "subjective", otherwise negative.  No Known Allergies  Current Outpatient Prescriptions  Medication Sig Dispense Refill  . atorvastatin (LIPITOR) 10 MG tablet Take 10 mg by mouth every evening.       Marland Kitchen atorvastatin (LIPITOR) 10 MG tablet TAKE 1 TABLET EVERY EVENING  90 tablet  2  . docusate sodium 100 MG CAPS Take 100 mg by mouth 2 (two) times daily.  20 capsule  0  . fluticasone (FLONASE) 50 MCG/ACT nasal spray Place 2 sprays into the nose daily as needed for allergies.       . furosemide (LASIX) 20 MG tablet Take 20 mg by mouth daily as needed for fluid.       Marland Kitchen glipiZIDE (GLUCOTROL) 5 MG tablet Take 5-10 mg by mouth 2 (two) times daily before a meal. He takes two tablets in the morning and one  tablet at night.      Marland Kitchen lisinopril (PRINIVIL,ZESTRIL) 20 MG tablet Take 20 mg by mouth 2 (two) times daily.      . metFORMIN (GLUCOPHAGE) 500 MG tablet Take 500 mg by mouth daily with breakfast.       . methocarbamol (ROBAXIN) 500 MG tablet Take 1 tablet (500 mg total) by mouth every 6 (six) hours as needed for muscle spasms.  40 tablet  1  . metoprolol (LOPRESSOR) 50 MG tablet Take 50 mg by mouth 2 (two) times daily.       Marland Kitchen oxyCODONE (OXY IR/ROXICODONE) 5 MG immediate release tablet Take 1-2 tablets (5-10 mg total) by mouth every 3 (three) hours as needed for breakthrough pain.  60 tablet  0  . pantoprazole (PROTONIX) 40 MG tablet Take 40 mg by mouth every morning.      . rivaroxaban (XARELTO) 10 MG TABS tablet Take 1 tablet (10 mg total) by mouth daily with breakfast.  18 tablet  0  . Tamsulosin HCl (FLOMAX) 0.4 MG CAPS Take 0.4 mg by mouth 2 (two) times daily.       . traMADol (ULTRAM) 50 MG tablet Take 1-2 tablets (50-100 mg total) by mouth every 6 (six) hours as needed for moderate pain.  60 tablet  0   No current facility-administered medications for this visit.   Facility-Administered Medications Ordered in Other Visits  Medication Dose Route Frequency Provider Last Rate Last Dose  . tranexamic acid (CYKLOKAPRON) 2,000  mg in sodium chloride 0.9 % 50 mL Topical Application  5,462 mg Topical Once Amber Renelda Loma, PA-C        Past Medical History  Diagnosis Date  . Hypertension   . Gout   . DM type 2 (diabetes mellitus, type 2)     for at least 10 years  . History of kidney stones   . Arthritis   . HOH (hard of hearing)   . GERD (gastroesophageal reflux disease)     Past Surgical History  Procedure Laterality Date  . Spinal cyst  1946  . Ankle surgery Right 1961    removal of ankle bone  . Cataract extraction w/phaco  03/11/2012    Procedure: CATARACT EXTRACTION PHACO AND INTRAOCULAR LENS PLACEMENT (IOC);  Surgeon: Tonny Branch, MD;  Location: [facility];  Service:  Ophthalmology;  Laterality: Left;  CDE 11.09  . Colonoscopy    . Colonoscopy N/A 03/01/2013    Procedure: COLONOSCOPY;  Surgeon: Rogene Houston, MD;  Location: [facility];  Service: Endoscopy;  Laterality: N/A;  730  . Eye surgery    . Total hip arthroplasty Right 12/22/2013    Procedure: RIGHT TOTAL HIP ARTHROPLASTY ANTERIOR APPROACH;  Surgeon: Gearlean Alf, MD;  Location: [facility];  Service: Orthopedics;  Laterality: Right;    History   Social History  . Marital Status: Married    Spouse Name: N/A    Number of Children: N/A  . Years of Education: N/A   Occupational History  . Not on file.   Social History Main Topics  . Smoking status: Former Smoker -- 2 years    Types: Cigarettes    Start date: 02/22/1943    Quit date: 05/05/1948  . Smokeless tobacco: Former Systems developer    Types: Chew    Quit date: 05/05/2002     Comment: used to chew tobacco, age 66   . Alcohol Use: No  . Drug Use: No  . Sexual Activity: Yes    Birth Control/ Protection: None   Other Topics Concern  . Not on file   Social History Narrative  . No narrative on file     Filed Vitals:   02/21/14 1029  BP: 175/76  Pulse: 60  Height: 5\' 9"  (1.753 m)  Weight: 176 lb 8 oz (80.06 kg)  SpO2: 98%    PHYSICAL EXAM General: NAD HEENT: Normal. Neck: No JVD, no thyromegaly. Lungs: Clear to auscultation bilaterally with normal respiratory effort. CV: Nondisplaced PMI.  Regular rate and rhythm, normal S1/S2, no S3/S4, no murmur. No pretibial or periankle edema.  No carotid bruit.  Normal pedal pulses.  Abdomen: Soft, nontender, no hepatosplenomegaly, no distention.  Neurologic: Alert and oriented x 3.  Psych: Normal affect. Skin: Normal. Musculoskeletal: Normal range of motion, no gross deformities. Extremities: No clubbing or cyanosis.   ECG: Most recent ECG reviewed.      ASSESSMENT AND PLAN: 1. CAD: Stable ischemic heart disease. Continue ASA 81 mg daily. Continue Lipitor. He previously had a  mildly abnormal stress test without high risk features and evidence of coronary calcifications on CT scan. Due to his age, poor functional capacity and no convincing symptoms of angina, I think continued medical therapy is the best option in his situation.  2. Mitral regurgitation: Moderate when last checked and asymptomatic from this standpoint. No murmur appreciated today. 3. Essential HTN: Elevated today. Will restart amlodipine 5 mg daily as he is already taking lisinopril 20 mg bid. 4. Hyperlipidemia: Continue Lipitor.  Dispo: f/u 6 months.   Kate Sable, M.D., F.A.C.C.

## 2014-09-23 ENCOUNTER — Other Ambulatory Visit: Payer: Self-pay | Admitting: Cardiovascular Disease

## 2014-10-03 ENCOUNTER — Other Ambulatory Visit: Payer: Self-pay | Admitting: Cardiovascular Disease

## 2014-10-03 MED ORDER — ATORVASTATIN CALCIUM 10 MG PO TABS: 10.0000 mg | ORAL_TABLET | Freq: Every evening | ORAL | Status: DC

## 2014-10-03 NOTE — Telephone Encounter (Signed)
Patient needs refill on Lipitor 10 mg requesting 90 day supply

## 2014-11-01 ENCOUNTER — Ambulatory Visit: Payer: Medicare Other | Admitting: Cardiovascular Disease

## 2014-11-27 ENCOUNTER — Encounter: Payer: Self-pay | Admitting: *Deleted

## 2014-11-27 ENCOUNTER — Ambulatory Visit (INDEPENDENT_AMBULATORY_CARE_PROVIDER_SITE_OTHER): Payer: Medicare Other | Admitting: Cardiovascular Disease

## 2014-11-27 ENCOUNTER — Encounter: Payer: Self-pay | Admitting: Cardiovascular Disease

## 2014-11-27 VITALS — BP 140/84 | HR 65 | Ht 69.0 in | Wt 184.0 lb

## 2014-11-27 DIAGNOSIS — E785 Hyperlipidemia, unspecified: Secondary | ICD-10-CM | POA: Diagnosis not present

## 2014-11-27 DIAGNOSIS — I10 Essential (primary) hypertension: Secondary | ICD-10-CM

## 2014-11-27 DIAGNOSIS — I2510 Atherosclerotic heart disease of native coronary artery without angina pectoris: Secondary | ICD-10-CM

## 2014-11-27 DIAGNOSIS — I340 Nonrheumatic mitral (valve) insufficiency: Secondary | ICD-10-CM | POA: Diagnosis not present

## 2014-11-27 NOTE — Patient Instructions (Signed)
Continue all current medications. Your physician wants you to follow up in: 6 months.  You will receive a reminder letter in the mail one-two months in advance.  If you don't receive a letter, please call our office to schedule the follow up appointment   

## 2014-11-27 NOTE — Progress Notes (Signed)
Patient ID: Wesley Garrison, male   DOB: [birth year], 81 y.o.   MRN: [id number]      SUBJECTIVE: Wesley Garrison presents for for routine follow up for CAD and mitral regurgitation. He has a history of type 2 diabetes and hypertension. He had an echocardiogram in 2014 performed for dyspnea which showed normal LV systolic function, moderate mitral regurgitation, mild aortic regurgitation and mild pulmonary hypertension. He had a CT of the chest which showed atherosclerosis and calcifications of the RCA and LAD.  He had a pharmacologic nuclear stress test in 07/2012 which showed mild partially reversible anteroseptal and inferior defect with normal EF, with no high risk features.   The patient denies any symptoms of chest pain, palpitations, shortness of breath, lightheadedness, dizziness, leg swelling, orthopnea, PND, and syncope. He lost a daughter to cancer earlier this year.   Review of Systems: As per "subjective", otherwise negative.  No Known Allergies  Current Outpatient Prescriptions  Medication Sig Dispense Refill  . amLODipine (NORVASC) 5 MG tablet Take 1 tablet (5 mg total) by mouth daily. 90 tablet 3  . atorvastatin (LIPITOR) 10 MG tablet Take 1 tablet (10 mg total) by mouth every evening. 90 tablet 0  . docusate sodium 100 MG CAPS Take 100 mg by mouth 2 (two) times daily. 20 capsule 0  . fluticasone (FLONASE) 50 MCG/ACT nasal spray Place 2 sprays into the nose daily as needed for allergies.     . furosemide (LASIX) 20 MG tablet Take 20 mg by mouth daily as needed for fluid.     Marland Kitchen glipiZIDE (GLUCOTROL) 5 MG tablet Take 5-10 mg by mouth 2 (two) times daily before a meal. He takes two tablets in the morning and one tablet at night.    Marland Kitchen lisinopril (PRINIVIL,ZESTRIL) 20 MG tablet Take 20 mg by mouth 2 (two) times daily.    . metFORMIN (GLUCOPHAGE) 500 MG tablet Take 500 mg by mouth daily with breakfast.     . metoprolol (LOPRESSOR) 50 MG tablet Take 50 mg by mouth 2 (two) times daily.      Marland Kitchen oxyCODONE (OXY IR/ROXICODONE) 5 MG immediate release tablet Take 1-2 tablets (5-10 mg total) by mouth every 3 (three) hours as needed for breakthrough pain. 60 tablet 0  . pantoprazole (PROTONIX) 40 MG tablet Take 40 mg by mouth every morning.    . Tamsulosin HCl (FLOMAX) 0.4 MG CAPS Take 0.4 mg by mouth 2 (two) times daily.      No current facility-administered medications for this visit.   Facility-Administered Medications Ordered in Other Visits  Medication Dose Route Frequency Provider Last Rate Last Dose  . tranexamic acid (CYKLOKAPRON) 2,000 mg in sodium chloride 0.9 % 50 mL Topical Application  2,671 mg Topical Once Ardeen Jourdain, PA-C        Past Medical History  Diagnosis Date  . Hypertension   . Gout   . DM type 2 (diabetes mellitus, type 2)     for at least 10 years  . History of kidney stones   . Arthritis   . HOH (hard of hearing)   . GERD (gastroesophageal reflux disease)     Past Surgical History  Procedure Laterality Date  . Spinal cyst  1946  . Ankle surgery Right 1961    removal of ankle bone  . Cataract extraction w/phaco  03/11/2012    Procedure: CATARACT EXTRACTION PHACO AND INTRAOCULAR LENS PLACEMENT (IOC);  Surgeon: Tonny Branch, MD;  Location: [facility];  Service: Ophthalmology;  Laterality:  Left;  CDE 11.09  . Colonoscopy    . Colonoscopy N/A 03/01/2013    Procedure: COLONOSCOPY;  Surgeon: Rogene Houston, MD;  [facility]: [facility];  Service: Endoscopy;  Laterality: N/A;  730  . Eye surgery    . Total hip arthroplasty Right 12/22/2013    Procedure: RIGHT TOTAL HIP ARTHROPLASTY ANTERIOR APPROACH;  Surgeon: Gearlean Alf, MD;  Location: [facility];  Service: Orthopedics;  Laterality: Right;    History   Social History  . Marital Status: Married    Spouse Name: N/A  . Number of Children: N/A  . Years of Education: N/A   Occupational History  . Not on file.   Social History Main Topics  . Smoking status: Former Smoker -- 2 years    Types:  Cigarettes    Start date: 02/22/1943    Quit date: 05/05/1948  . Smokeless tobacco: Former Systems developer    Types: Chew    Quit date: 05/05/2002     Comment: used to chew tobacco, age 69   . Alcohol Use: No  . Drug Use: No  . Sexual Activity: Yes    Birth Control/ Protection: None   Other Topics Concern  . Not on file   Social History Narrative     Filed Vitals:   11/27/14 1112  BP: 140/84  Pulse: 65  Height: 5\' 9"  (1.753 m)  Weight: 184 lb (83.462 kg)  SpO2: 98%    PHYSICAL EXAM General: NAD HEENT: Normal. Neck: No JVD, no thyromegaly. Lungs: Clear to auscultation bilaterally with normal respiratory effort. CV: Nondisplaced PMI.  Regular rate and rhythm, normal S1/S2, no S3/S4, no murmur. No pretibial or periankle edema.  No carotid bruit.  Abdomen: Soft, nontender, no distention.  Neurologic: Alert and oriented x 3.  Psych: Normal affect. Skin: Normal. Musculoskeletal: No gross deformities. Extremities: No clubbing or cyanosis.   ECG: Most recent ECG reviewed.      ASSESSMENT AND PLAN: 1. CAD: Stable ischemic heart disease. Continue ASA 81 mg daily and Lipitor. Mildly abnormal stress test in 07/2012 without high risk features and evidence of coronary calcifications on CT scan. Due to his age, poor functional capacity, and no convincing symptoms of angina, I think continued medical therapy is the best option in his situation.   2. Mitral regurgitation: Moderate when last checked and asymptomatic from this standpoint. No murmur appreciated today.  3. Essential HTN: Reasonably controlled for age. No changes.  4. Hyperlipidemia: Continue Lipitor. Obtain copy of lipids from PCP.  Dispo: f/u 6 months.   Kate Sable, M.D., F.A.C.C.

## 2015-01-01 ENCOUNTER — Other Ambulatory Visit: Payer: Self-pay | Admitting: Cardiovascular Disease

## 2015-01-12 ENCOUNTER — Telehealth: Payer: Self-pay | Admitting: *Deleted

## 2015-01-12 NOTE — Telephone Encounter (Signed)
Patient seen last by Dr. Bronson Ing on 11/27/14.  Request was sent to Dr. Woody Seller office for most recent Lipids.  Nothing received to date.  Call placed to patient - he was unable to tell me when he had last labs done.  Will fax another current request to PMD office.

## 2015-01-27 ENCOUNTER — Encounter: Payer: Self-pay | Admitting: Cardiovascular Disease

## 2015-05-15 ENCOUNTER — Other Ambulatory Visit: Payer: Self-pay | Admitting: Cardiovascular Disease

## 2015-05-21 ENCOUNTER — Ambulatory Visit (INDEPENDENT_AMBULATORY_CARE_PROVIDER_SITE_OTHER): Payer: Medicare Other | Admitting: Cardiovascular Disease

## 2015-05-21 ENCOUNTER — Encounter: Payer: Self-pay | Admitting: Cardiovascular Disease

## 2015-05-21 VITALS — BP 145/74 | HR 65 | Ht 69.0 in | Wt 181.0 lb

## 2015-05-21 DIAGNOSIS — E785 Hyperlipidemia, unspecified: Secondary | ICD-10-CM

## 2015-05-21 DIAGNOSIS — I2510 Atherosclerotic heart disease of native coronary artery without angina pectoris: Secondary | ICD-10-CM | POA: Diagnosis not present

## 2015-05-21 DIAGNOSIS — I10 Essential (primary) hypertension: Secondary | ICD-10-CM

## 2015-05-21 DIAGNOSIS — I340 Nonrheumatic mitral (valve) insufficiency: Secondary | ICD-10-CM

## 2015-05-21 NOTE — Patient Instructions (Signed)

## 2015-05-21 NOTE — Progress Notes (Signed)
Patient ID: Wesley Garrison, male   DOB: 1925/02/13, 82 y.o.   MRN: [id number]      SUBJECTIVE: Wesley Garrison presents for for routine follow up for CAD and mitral regurgitation. He has a history of type 2 diabetes and hypertension. He had an echocardiogram in 2014 performed for dyspnea which showed normal LV systolic function, moderate mitral regurgitation, mild aortic regurgitation and mild pulmonary hypertension. He had a CT of the chest which showed atherosclerosis and calcifications of the RCA and LAD.   He had a pharmacologic nuclear stress test in 07/2012 which showed mild partially reversible anteroseptal and inferior defect with normal EF, with no high risk features.   The patient denies any symptoms of chest pain, palpitations, shortness of breath, lightheadedness, dizziness, leg swelling, orthopnea, PND, and syncope. Feels like he is doing very well. Had a good Christmas with both sons.   Review of Systems: As per "subjective", otherwise negative.  No Known Allergies  Current Outpatient Prescriptions  Medication Sig Dispense Refill  . amLODipine (NORVASC) 5 MG tablet Take 1 tablet (5 mg total) by mouth daily. 90 tablet 3  . atorvastatin (LIPITOR) 20 MG tablet Take 1 tablet by mouth daily.    Marland Kitchen docusate sodium 100 MG CAPS Take 100 mg by mouth 2 (two) times daily. 20 capsule 0  . fluticasone (FLONASE) 50 MCG/ACT nasal spray Place 2 sprays into the nose daily as needed for allergies.     . furosemide (LASIX) 20 MG tablet Take 20 mg by mouth daily as needed for fluid.     Marland Kitchen GLIPIZIDE XL 5 MG 24 hr tablet Take 2 tablets by mouth 2 (two) times daily.    Marland Kitchen JANUVIA 100 MG tablet Take 1 tablet by mouth daily.    Marland Kitchen lisinopril (PRINIVIL,ZESTRIL) 20 MG tablet Take 20 mg by mouth 2 (two) times daily.    . metFORMIN (GLUCOPHAGE) 500 MG tablet Take 500 mg by mouth daily with breakfast.     . metoprolol (LOPRESSOR) 50 MG tablet Take 50 mg by mouth 2 (two) times daily.     Marland Kitchen oxyCODONE (OXY  IR/ROXICODONE) 5 MG immediate release tablet Take 1-2 tablets (5-10 mg total) by mouth every 3 (three) hours as needed for breakthrough pain. 60 tablet 0  . pantoprazole (PROTONIX) 40 MG tablet Take 40 mg by mouth every morning.    . Tamsulosin HCl (FLOMAX) 0.4 MG CAPS Take 0.4 mg by mouth 2 (two) times daily.      No current facility-administered medications for this visit.   Facility-Administered Medications Ordered in Other Visits  Medication Dose Route Frequency Provider Last Rate Last Dose  . tranexamic acid (CYKLOKAPRON) 2,000 mg in sodium chloride 0.9 % 50 mL Topical Application  123XX123 mg Topical Once Ardeen Jourdain, PA-C        Past Medical History  Diagnosis Date  . Hypertension   . Gout   . DM type 2 (diabetes mellitus, type 2) ([place])     for at least 10 years  . History of kidney stones   . Arthritis   . HOH (hard of hearing)   . GERD (gastroesophageal reflux disease)     Past Surgical History  Procedure Laterality Date  . Spinal cyst  1946  . Ankle surgery Right 1961    removal of ankle bone  . Cataract extraction w/phaco  03/11/2012    Procedure: CATARACT EXTRACTION PHACO AND INTRAOCULAR LENS PLACEMENT (IOC);  Surgeon: Tonny Branch, MD;  Location: [facility];  Service:  Ophthalmology;  Laterality: Left;  CDE 11.09  . Colonoscopy    . Colonoscopy N/A 03/01/2013    Procedure: COLONOSCOPY;  Surgeon: Rogene Houston, MD;  [facility];  Service: Endoscopy;  Laterality: N/A;  730  . Eye surgery    . Total hip arthroplasty Right 12/22/2013    Procedure: RIGHT TOTAL HIP ARTHROPLASTY ANTERIOR APPROACH;  Surgeon: Gearlean Alf, MD;  Location: [facility];  Service: Orthopedics;  Laterality: Right;    Social History   Social History  . Marital Status: Married    Spouse Name: N/A  . Number of Children: N/A  . Years of Education: N/A   Occupational History  . Not on file.   Social History Main Topics  . Smoking status: Former Smoker -- 2 years    Types: Cigarettes      Start date: 02/22/1943    Quit date: 05/05/1948  . Smokeless tobacco: Former Systems developer    Types: Chew    Quit date: 05/05/2002     Comment: used to chew tobacco, age 6   . Alcohol Use: No  . Drug Use: No  . Sexual Activity: Yes    Birth Control/ Protection: None   Other Topics Concern  . Not on file   Social History Narrative     Filed Vitals:   05/21/15 1034  Height: 5\' 9"  (1.753 m)  Weight: 181 lb (82.101 kg)    PHYSICAL EXAM General: NAD HEENT: Normal. Neck: No JVD, no thyromegaly. Lungs: Clear to auscultation bilaterally with normal respiratory effort. CV: Nondisplaced PMI. Regular rate and rhythm, normal S1/S2, no S3/S4, no murmur. No pretibial or periankle edema. No carotid bruit.  Abdomen: Soft, nontender, no distention.  Neurologic: Alert and oriented x 3.  Psych: Normal affect. Skin: Normal. Musculoskeletal: No gross deformities. Extremities: No clubbing or cyanosis.   ECG: Most recent ECG reviewed.      ASSESSMENT AND PLAN: 1. CAD: Stable ischemic heart disease. Continue ASA 81 mg daily and Lipitor. Mildly abnormal stress test in 07/2012 without high risk features and evidence of coronary calcifications on CT scan. Due to his age, poor functional capacity, and no convincing symptoms of angina, I think continued medical therapy is the best option in his situation.   2. Mitral regurgitation: Moderate when last checked and asymptomatic from this standpoint. No murmur appreciated today.  3. Essential HTN: Reasonably controlled for age. No changes.  4. Hyperlipidemia: Continue Lipitor.   Dispo: f/u 1 year or sooner should problems arise.   Kate Sable, M.D., F.A.C.C.

## 2015-05-23 DIAGNOSIS — Z418 Encounter for other procedures for purposes other than remedying health state: Secondary | ICD-10-CM | POA: Diagnosis not present

## 2015-05-23 DIAGNOSIS — J069 Acute upper respiratory infection, unspecified: Secondary | ICD-10-CM | POA: Diagnosis not present

## 2015-05-23 DIAGNOSIS — M79606 Pain in leg, unspecified: Secondary | ICD-10-CM | POA: Diagnosis not present

## 2015-05-23 DIAGNOSIS — Z789 Other specified health status: Secondary | ICD-10-CM | POA: Diagnosis not present

## 2015-05-23 DIAGNOSIS — I10 Essential (primary) hypertension: Secondary | ICD-10-CM | POA: Diagnosis not present

## 2015-05-30 DIAGNOSIS — M25531 Pain in right wrist: Secondary | ICD-10-CM | POA: Diagnosis not present

## 2015-05-30 DIAGNOSIS — G5603 Carpal tunnel syndrome, bilateral upper limbs: Secondary | ICD-10-CM | POA: Diagnosis not present

## 2015-05-30 DIAGNOSIS — M19041 Primary osteoarthritis, right hand: Secondary | ICD-10-CM | POA: Diagnosis not present

## 2015-05-30 DIAGNOSIS — M19042 Primary osteoarthritis, left hand: Secondary | ICD-10-CM | POA: Diagnosis not present

## 2015-05-30 DIAGNOSIS — G5601 Carpal tunnel syndrome, right upper limb: Secondary | ICD-10-CM | POA: Diagnosis not present

## 2015-06-11 DIAGNOSIS — M79609 Pain in unspecified limb: Secondary | ICD-10-CM | POA: Diagnosis not present

## 2015-06-11 DIAGNOSIS — M79604 Pain in right leg: Secondary | ICD-10-CM | POA: Diagnosis not present

## 2015-06-11 DIAGNOSIS — M79605 Pain in left leg: Secondary | ICD-10-CM | POA: Diagnosis not present

## 2015-06-22 DIAGNOSIS — R58 Hemorrhage, not elsewhere classified: Secondary | ICD-10-CM | POA: Diagnosis not present

## 2015-06-22 DIAGNOSIS — E1165 Type 2 diabetes mellitus with hyperglycemia: Secondary | ICD-10-CM | POA: Diagnosis not present

## 2015-07-18 DIAGNOSIS — Z85828 Personal history of other malignant neoplasm of skin: Secondary | ICD-10-CM | POA: Diagnosis not present

## 2015-07-18 DIAGNOSIS — L708 Other acne: Secondary | ICD-10-CM | POA: Diagnosis not present

## 2015-07-18 DIAGNOSIS — D225 Melanocytic nevi of trunk: Secondary | ICD-10-CM | POA: Diagnosis not present

## 2015-07-18 DIAGNOSIS — L821 Other seborrheic keratosis: Secondary | ICD-10-CM | POA: Diagnosis not present

## 2015-07-18 DIAGNOSIS — D1801 Hemangioma of skin and subcutaneous tissue: Secondary | ICD-10-CM | POA: Diagnosis not present

## 2015-07-18 DIAGNOSIS — L570 Actinic keratosis: Secondary | ICD-10-CM | POA: Diagnosis not present

## 2015-08-23 DIAGNOSIS — I10 Essential (primary) hypertension: Secondary | ICD-10-CM | POA: Diagnosis not present

## 2015-08-23 DIAGNOSIS — E1165 Type 2 diabetes mellitus with hyperglycemia: Secondary | ICD-10-CM | POA: Diagnosis not present

## 2015-08-23 DIAGNOSIS — M1990 Unspecified osteoarthritis, unspecified site: Secondary | ICD-10-CM | POA: Diagnosis not present

## 2015-08-27 DIAGNOSIS — M25531 Pain in right wrist: Secondary | ICD-10-CM | POA: Diagnosis not present

## 2015-08-27 DIAGNOSIS — G5603 Carpal tunnel syndrome, bilateral upper limbs: Secondary | ICD-10-CM | POA: Diagnosis not present

## 2015-08-27 DIAGNOSIS — M65332 Trigger finger, left middle finger: Secondary | ICD-10-CM | POA: Diagnosis not present

## 2015-09-03 DIAGNOSIS — G5603 Carpal tunnel syndrome, bilateral upper limbs: Secondary | ICD-10-CM | POA: Diagnosis not present

## 2015-09-20 DIAGNOSIS — E119 Type 2 diabetes mellitus without complications: Secondary | ICD-10-CM | POA: Diagnosis not present

## 2015-09-20 DIAGNOSIS — E7800 Pure hypercholesterolemia, unspecified: Secondary | ICD-10-CM | POA: Diagnosis not present

## 2015-09-20 DIAGNOSIS — I10 Essential (primary) hypertension: Secondary | ICD-10-CM | POA: Diagnosis not present

## 2015-09-24 DIAGNOSIS — Z6828 Body mass index (BMI) 28.0-28.9, adult: Secondary | ICD-10-CM | POA: Diagnosis not present

## 2015-09-24 DIAGNOSIS — M1710 Unilateral primary osteoarthritis, unspecified knee: Secondary | ICD-10-CM | POA: Diagnosis not present

## 2015-09-24 DIAGNOSIS — Z713 Dietary counseling and surveillance: Secondary | ICD-10-CM | POA: Diagnosis not present

## 2015-09-24 DIAGNOSIS — G5600 Carpal tunnel syndrome, unspecified upper limb: Secondary | ICD-10-CM | POA: Diagnosis not present

## 2015-10-19 DIAGNOSIS — G5601 Carpal tunnel syndrome, right upper limb: Secondary | ICD-10-CM | POA: Diagnosis not present

## 2015-10-26 DIAGNOSIS — Z4789 Encounter for other orthopedic aftercare: Secondary | ICD-10-CM | POA: Diagnosis not present

## 2015-10-31 DIAGNOSIS — M25531 Pain in right wrist: Secondary | ICD-10-CM | POA: Diagnosis not present

## 2015-11-09 DIAGNOSIS — Z4789 Encounter for other orthopedic aftercare: Secondary | ICD-10-CM | POA: Diagnosis not present

## 2015-11-20 DIAGNOSIS — I10 Essential (primary) hypertension: Secondary | ICD-10-CM | POA: Diagnosis not present

## 2015-11-20 DIAGNOSIS — E7800 Pure hypercholesterolemia, unspecified: Secondary | ICD-10-CM | POA: Diagnosis not present

## 2015-11-20 DIAGNOSIS — E119 Type 2 diabetes mellitus without complications: Secondary | ICD-10-CM | POA: Diagnosis not present

## 2015-12-03 DIAGNOSIS — I10 Essential (primary) hypertension: Secondary | ICD-10-CM | POA: Diagnosis not present

## 2015-12-03 DIAGNOSIS — M1990 Unspecified osteoarthritis, unspecified site: Secondary | ICD-10-CM | POA: Diagnosis not present

## 2015-12-03 DIAGNOSIS — E1165 Type 2 diabetes mellitus with hyperglycemia: Secondary | ICD-10-CM | POA: Diagnosis not present

## 2015-12-18 DIAGNOSIS — E7800 Pure hypercholesterolemia, unspecified: Secondary | ICD-10-CM | POA: Diagnosis not present

## 2015-12-18 DIAGNOSIS — E119 Type 2 diabetes mellitus without complications: Secondary | ICD-10-CM | POA: Diagnosis not present

## 2015-12-18 DIAGNOSIS — I10 Essential (primary) hypertension: Secondary | ICD-10-CM | POA: Diagnosis not present

## 2015-12-31 DIAGNOSIS — Z4789 Encounter for other orthopedic aftercare: Secondary | ICD-10-CM | POA: Diagnosis not present

## 2016-01-16 DIAGNOSIS — H2511 Age-related nuclear cataract, right eye: Secondary | ICD-10-CM | POA: Diagnosis not present

## 2016-01-16 DIAGNOSIS — E119 Type 2 diabetes mellitus without complications: Secondary | ICD-10-CM | POA: Diagnosis not present

## 2016-01-16 DIAGNOSIS — Z961 Presence of intraocular lens: Secondary | ICD-10-CM | POA: Diagnosis not present

## 2016-01-16 DIAGNOSIS — Z7984 Long term (current) use of oral hypoglycemic drugs: Secondary | ICD-10-CM | POA: Diagnosis not present

## 2016-02-05 DIAGNOSIS — Z23 Encounter for immunization: Secondary | ICD-10-CM | POA: Diagnosis not present

## 2016-02-08 DIAGNOSIS — Z299 Encounter for prophylactic measures, unspecified: Secondary | ICD-10-CM | POA: Diagnosis not present

## 2016-02-08 DIAGNOSIS — Z1211 Encounter for screening for malignant neoplasm of colon: Secondary | ICD-10-CM | POA: Diagnosis not present

## 2016-02-08 DIAGNOSIS — R5383 Other fatigue: Secondary | ICD-10-CM | POA: Diagnosis not present

## 2016-02-08 DIAGNOSIS — E7800 Pure hypercholesterolemia, unspecified: Secondary | ICD-10-CM | POA: Diagnosis not present

## 2016-02-08 DIAGNOSIS — Z79899 Other long term (current) drug therapy: Secondary | ICD-10-CM | POA: Diagnosis not present

## 2016-02-08 DIAGNOSIS — Z0000 Encounter for general adult medical examination without abnormal findings: Secondary | ICD-10-CM | POA: Diagnosis not present

## 2016-02-08 DIAGNOSIS — Z1389 Encounter for screening for other disorder: Secondary | ICD-10-CM | POA: Diagnosis not present

## 2016-02-08 DIAGNOSIS — Z125 Encounter for screening for malignant neoplasm of prostate: Secondary | ICD-10-CM | POA: Diagnosis not present

## 2016-02-08 DIAGNOSIS — Z7189 Other specified counseling: Secondary | ICD-10-CM | POA: Diagnosis not present

## 2016-03-10 DIAGNOSIS — E1165 Type 2 diabetes mellitus with hyperglycemia: Secondary | ICD-10-CM | POA: Diagnosis not present

## 2016-03-10 DIAGNOSIS — Z299 Encounter for prophylactic measures, unspecified: Secondary | ICD-10-CM | POA: Diagnosis not present

## 2016-03-10 DIAGNOSIS — I10 Essential (primary) hypertension: Secondary | ICD-10-CM | POA: Diagnosis not present

## 2016-04-23 DIAGNOSIS — M545 Low back pain: Secondary | ICD-10-CM | POA: Diagnosis not present

## 2016-04-23 DIAGNOSIS — R319 Hematuria, unspecified: Secondary | ICD-10-CM | POA: Diagnosis not present

## 2016-04-23 DIAGNOSIS — N390 Urinary tract infection, site not specified: Secondary | ICD-10-CM | POA: Diagnosis not present

## 2016-04-23 DIAGNOSIS — E1165 Type 2 diabetes mellitus with hyperglycemia: Secondary | ICD-10-CM | POA: Diagnosis not present

## 2016-04-23 DIAGNOSIS — Z299 Encounter for prophylactic measures, unspecified: Secondary | ICD-10-CM | POA: Diagnosis not present

## 2016-05-27 ENCOUNTER — Encounter: Payer: Self-pay | Admitting: Cardiovascular Disease

## 2016-05-27 ENCOUNTER — Ambulatory Visit (INDEPENDENT_AMBULATORY_CARE_PROVIDER_SITE_OTHER): Payer: Medicare Other | Admitting: Cardiovascular Disease

## 2016-05-27 VITALS — BP 128/72 | HR 70 | Ht 69.0 in | Wt 170.0 lb

## 2016-05-27 DIAGNOSIS — E782 Mixed hyperlipidemia: Secondary | ICD-10-CM

## 2016-05-27 DIAGNOSIS — I2510 Atherosclerotic heart disease of native coronary artery without angina pectoris: Secondary | ICD-10-CM

## 2016-05-27 DIAGNOSIS — I10 Essential (primary) hypertension: Secondary | ICD-10-CM

## 2016-05-27 DIAGNOSIS — I340 Nonrheumatic mitral (valve) insufficiency: Secondary | ICD-10-CM | POA: Diagnosis not present

## 2016-05-27 NOTE — Progress Notes (Signed)
SUBJECTIVE: Mr. Wesley Garrison presents for for routine follow up for CAD and mitral regurgitation. He has a history of type 2 diabetes and hypertension.   He had an echocardiogram in 2014 performed for dyspnea which showed normal LV systolic function, moderate mitral regurgitation, mild aortic regurgitation and mild pulmonary hypertension. He had a CT of the chest which showed atherosclerosis and calcifications of the RCA and LAD.   He had a pharmacologic nuclear stress test in 07/2012 which showed mild partially reversible anteroseptal and inferior defects with normal EF, with no high risk features.   The patient denies any symptoms of chest pain, palpitations, shortness of breath, lightheadedness, dizziness, leg swelling, orthopnea, PND, and syncope.  ECG performed today shows sinus rhythm with a nonspecific T wave abnormality and an isolated PAC.  Review of Systems: As per "subjective", otherwise negative.  No Known Allergies  Current Outpatient Prescriptions  Medication Sig Dispense Refill  . aspirin EC 81 MG tablet Take 81 mg by mouth daily.    Marland Kitchen atorvastatin (LIPITOR) 20 MG tablet Take 1 tablet by mouth daily.    Marland Kitchen docusate sodium 100 MG CAPS Take 100 mg by mouth 2 (two) times daily. 20 capsule 0  . fluticasone (FLONASE) 50 MCG/ACT nasal spray Place 2 sprays into the nose daily as needed for allergies.     . furosemide (LASIX) 20 MG tablet Take 20 mg by mouth daily as needed for fluid.     Marland Kitchen GLIPIZIDE XL 5 MG 24 hr tablet Take 2 tablets by mouth 2 (two) times daily.    Marland Kitchen JANUVIA 100 MG tablet Take 1 tablet by mouth daily.    Marland Kitchen lisinopril (PRINIVIL,ZESTRIL) 20 MG tablet Take 20 mg by mouth 2 (two) times daily.    . metFORMIN (GLUCOPHAGE) 500 MG tablet Take 500 mg by mouth daily with breakfast.     . metoprolol (LOPRESSOR) 50 MG tablet Take 50 mg by mouth 2 (two) times daily.     Marland Kitchen oxyCODONE-acetaminophen (PERCOCET/ROXICET) 5-325 MG tablet Take 1 tablet by mouth every 8 (eight)  hours as needed for severe pain.    . pantoprazole (PROTONIX) 40 MG tablet Take 40 mg by mouth every morning.    . potassium chloride (MICRO-K) 10 MEQ CR capsule Take 10 mEq by mouth daily as needed.    . Tamsulosin HCl (FLOMAX) 0.4 MG CAPS Take 0.4 mg by mouth 2 (two) times daily.      No current facility-administered medications for this visit.    Facility-Administered Medications Ordered in Other Visits  Medication Dose Route Frequency Provider Last Rate Last Dose  . tranexamic acid (CYKLOKAPRON) 2,000 mg in sodium chloride 0.9 % 50 mL Topical Application  123XX123 mg Topical Once Ardeen Jourdain, PA-C        Past Medical History:  Diagnosis Date  . Arthritis   . DM type 2 (diabetes mellitus, type 2) ([place])    for at least 10 years  . GERD (gastroesophageal reflux disease)   . Gout   . History of kidney stones   . HOH (hard of hearing)   . Hypertension     Past Surgical History:  Procedure Laterality Date  . ANKLE SURGERY Right 1961   removal of ankle bone  . CATARACT EXTRACTION W/PHACO  03/11/2012   Procedure: CATARACT EXTRACTION PHACO AND INTRAOCULAR LENS PLACEMENT (IOC);  Surgeon: Tonny Branch, MD;  Location: [facility];  Service: Ophthalmology;  Laterality: Left;  CDE 11.09  . COLONOSCOPY    .  COLONOSCOPY N/A 03/01/2013   Procedure: COLONOSCOPY;  Surgeon: Rogene Houston, MD;  [facility];  Service: Endoscopy;  Laterality: N/A;  730  . EYE SURGERY    . spinal cyst  1946  . TOTAL HIP ARTHROPLASTY Right 12/22/2013   Procedure: RIGHT TOTAL HIP ARTHROPLASTY ANTERIOR APPROACH;  Surgeon: Gearlean Alf, MD;  Location: [facility];  Service: Orthopedics;  Laterality: Right;    Social History   Social History  . Marital status: Married    Spouse name: N/A  . Number of children: N/A  . Years of education: N/A   Occupational History  . Not on file.   Social History Main Topics  . Smoking status: Former Smoker    Years: 2.00    Types: Cigarettes    Start date: 02/22/1943     Quit date: 05/05/1948  . Smokeless tobacco: Former Systems developer    Types: Chew    Quit date: 05/05/2002     Comment: used to chew tobacco, age 48   . Alcohol use No  . Drug use: No  . Sexual activity: Yes    Birth control/ protection: None   Other Topics Concern  . Not on file   Social History Narrative  . No narrative on file     Vitals:   05/27/16 1343  BP: 128/72  Pulse: 70  SpO2: 96%  Weight: 170 lb (77.1 kg)  Height: 5\' 9"  (1.753 m)    PHYSICAL EXAM General: NAD HEENT: Normal. Neck: No JVD, no thyromegaly. Lungs: Clear to auscultation bilaterally with normal respiratory effort. CV: Nondisplaced PMI.  Regular rate and rhythm, normal S1/S2, no S3/S4, no murmur. No pretibial or periankle edema.  No carotid bruit.   Abdomen: Soft, nontender, no distention.  Neurologic: Alert and oriented.  Psych: Normal affect. Skin: Normal. Musculoskeletal: No gross deformities.    ECG: Most recent ECG reviewed.      ASSESSMENT AND PLAN: 1. CAD: Stable ischemic heart disease. Continue ASA 81 mg daily and Lipitor. Mildly abnormal stress test in 07/2012 without high risk features and evidence of coronary calcifications on CT scan. Due to his age, poor functional capacity, and no convincing symptoms of angina, I think continued medical therapy is the best option in his situation.   2. Mitral regurgitation: Moderate when last checked and asymptomatic from this standpoint. No murmur appreciated today.  3. Essential HTN: Controlled. No changes.  4. Hyperlipidemia: Continue Lipitor.   Dispo: f/u 1 year or sooner should problems arise.   Kate Sable, M.D., F.A.C.C.

## 2016-05-27 NOTE — Patient Instructions (Signed)

## 2016-06-04 DIAGNOSIS — Z713 Dietary counseling and surveillance: Secondary | ICD-10-CM | POA: Diagnosis not present

## 2016-06-04 DIAGNOSIS — Z6827 Body mass index (BMI) 27.0-27.9, adult: Secondary | ICD-10-CM | POA: Diagnosis not present

## 2016-06-04 DIAGNOSIS — H6120 Impacted cerumen, unspecified ear: Secondary | ICD-10-CM | POA: Diagnosis not present

## 2016-06-04 DIAGNOSIS — M545 Low back pain: Secondary | ICD-10-CM | POA: Diagnosis not present

## 2016-06-04 DIAGNOSIS — E1165 Type 2 diabetes mellitus with hyperglycemia: Secondary | ICD-10-CM | POA: Diagnosis not present

## 2016-06-04 DIAGNOSIS — I2510 Atherosclerotic heart disease of native coronary artery without angina pectoris: Secondary | ICD-10-CM | POA: Diagnosis not present

## 2016-06-04 DIAGNOSIS — R509 Fever, unspecified: Secondary | ICD-10-CM | POA: Diagnosis not present

## 2016-06-16 DIAGNOSIS — M461 Sacroiliitis, not elsewhere classified: Secondary | ICD-10-CM | POA: Diagnosis not present

## 2016-06-16 DIAGNOSIS — E7800 Pure hypercholesterolemia, unspecified: Secondary | ICD-10-CM | POA: Diagnosis not present

## 2016-06-16 DIAGNOSIS — I10 Essential (primary) hypertension: Secondary | ICD-10-CM | POA: Diagnosis not present

## 2016-06-16 DIAGNOSIS — M1710 Unilateral primary osteoarthritis, unspecified knee: Secondary | ICD-10-CM | POA: Diagnosis not present

## 2016-06-16 DIAGNOSIS — Z299 Encounter for prophylactic measures, unspecified: Secondary | ICD-10-CM | POA: Diagnosis not present

## 2016-06-16 DIAGNOSIS — E1165 Type 2 diabetes mellitus with hyperglycemia: Secondary | ICD-10-CM | POA: Diagnosis not present

## 2016-06-18 IMAGING — CR DG HIP COMPLETE 2+V*R*
3 series · 3 of 3 positions shown · non-contrast
Comparison: None.

CLINICAL DATA: Preop for hip surgery.

EXAM:
RIGHT HIP - COMPLETE 2+ VIEW

[t pelvis a.p.]
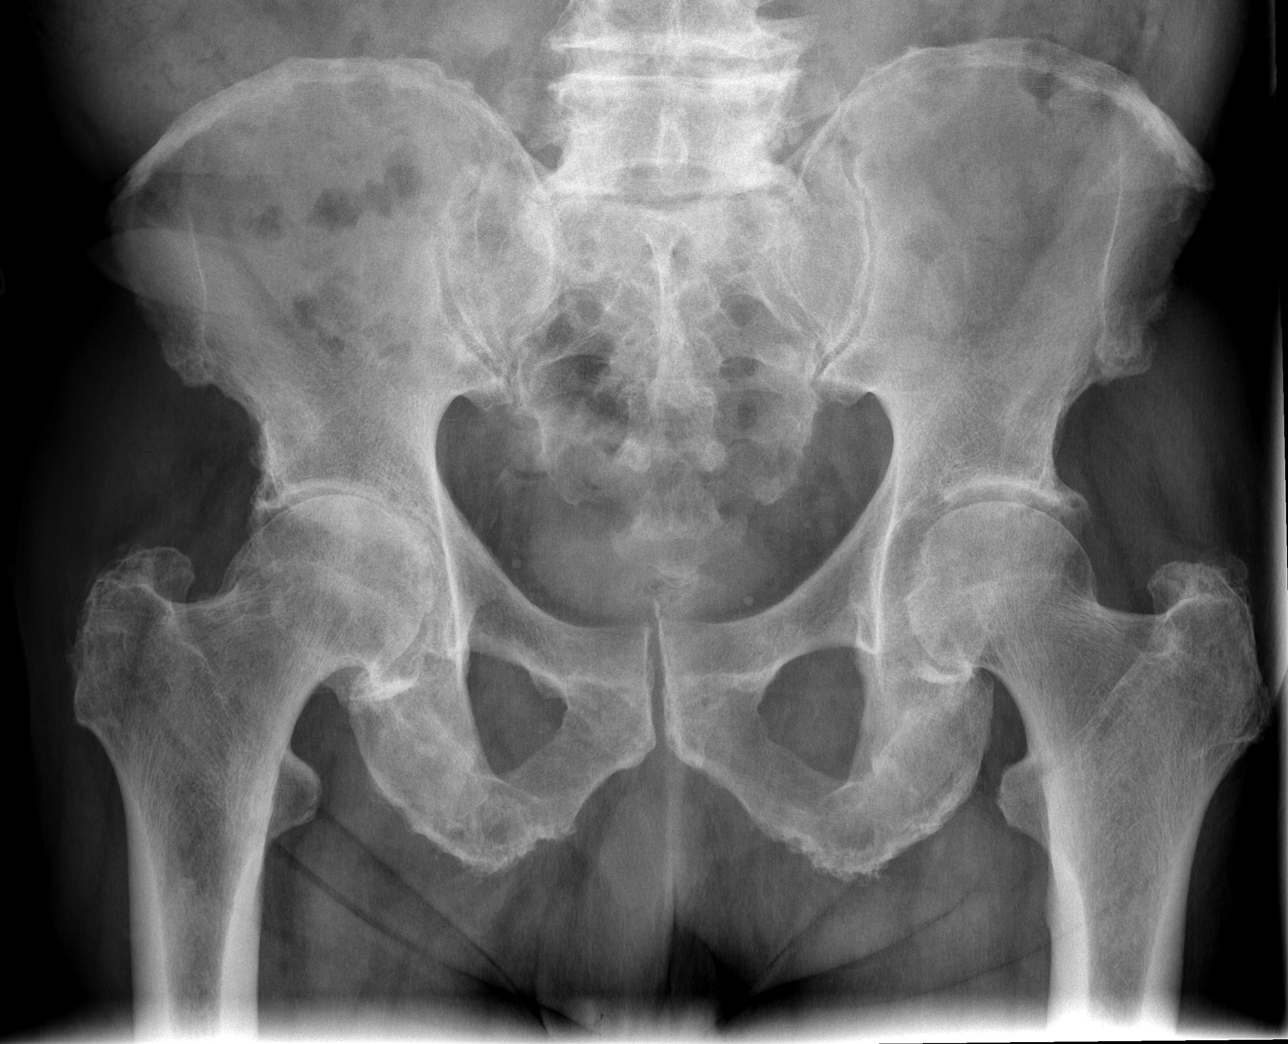

[t hip ap right]
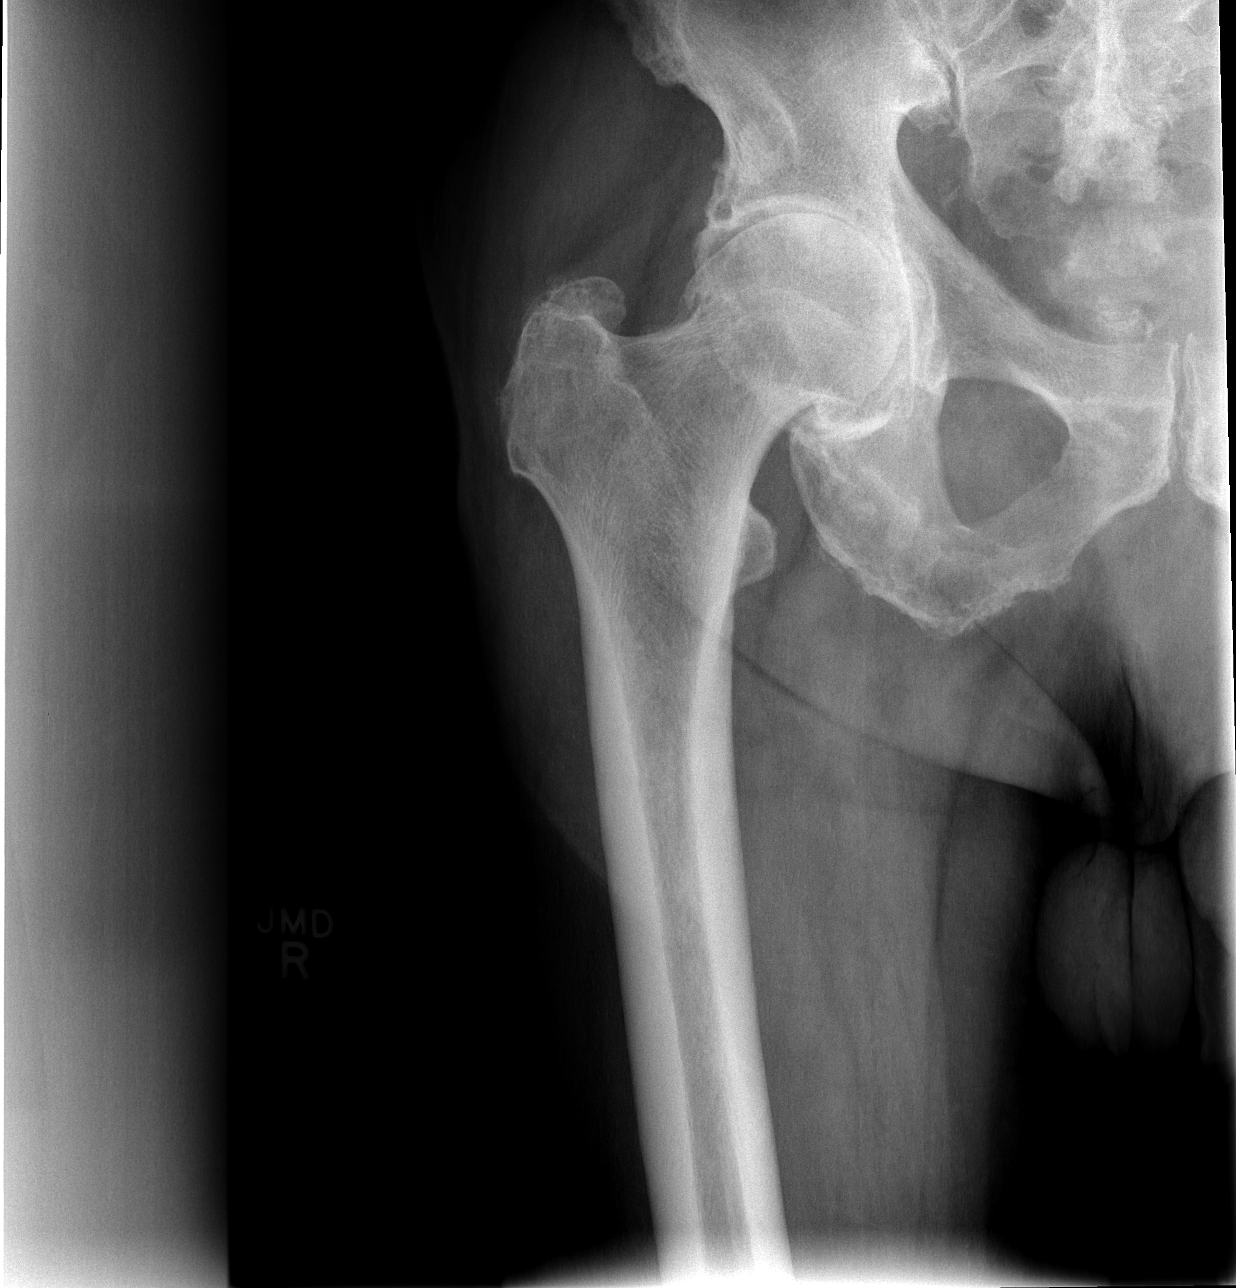

[t hip frog leg right]
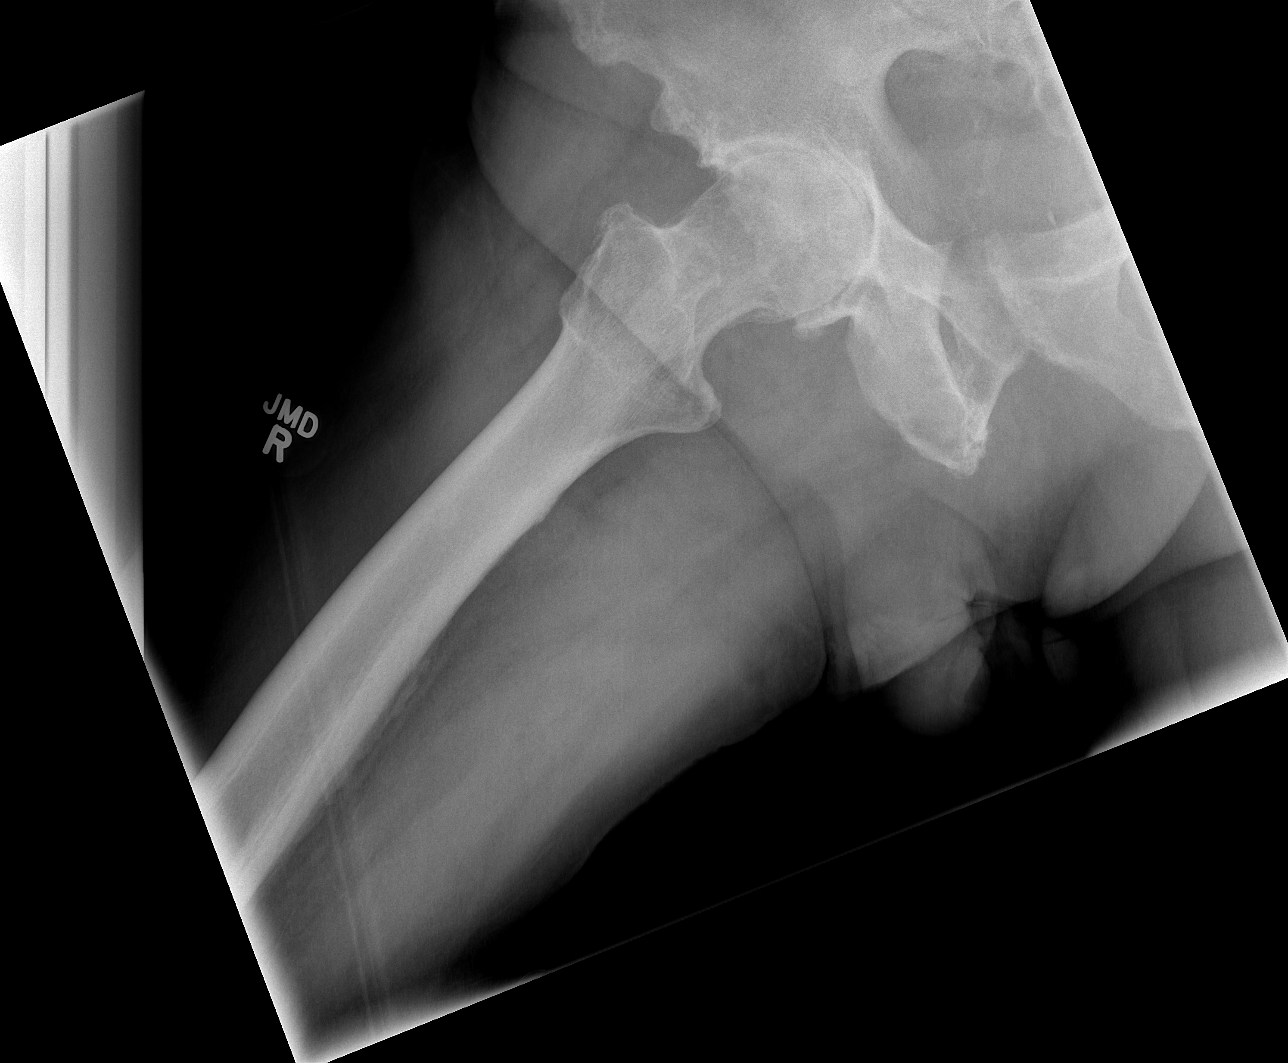

[3 of 3 positions shown; findings below may reference images not displayed]

FINDINGS: There is marked axial hip joint space narrowing on the right with
osteophytes along the base of the right femoral head and from the
superior right acetabulum. Left hip joint space is well preserved.

No fracture. No bone lesion. SI joints and pubic symphysis are well
aligned.

Soft tissues are unremarkable.
IMPRESSION: Advanced arthropathic changes of the right hip. No fracture or acute
finding.

## 2016-06-23 DIAGNOSIS — I10 Essential (primary) hypertension: Secondary | ICD-10-CM | POA: Diagnosis not present

## 2016-06-23 DIAGNOSIS — E119 Type 2 diabetes mellitus without complications: Secondary | ICD-10-CM | POA: Diagnosis not present

## 2016-06-23 DIAGNOSIS — E7800 Pure hypercholesterolemia, unspecified: Secondary | ICD-10-CM | POA: Diagnosis not present

## 2016-06-25 DIAGNOSIS — M130 Polyarthritis, unspecified: Secondary | ICD-10-CM | POA: Diagnosis not present

## 2016-06-25 DIAGNOSIS — M545 Low back pain: Secondary | ICD-10-CM | POA: Diagnosis not present

## 2016-06-25 DIAGNOSIS — E119 Type 2 diabetes mellitus without complications: Secondary | ICD-10-CM | POA: Diagnosis not present

## 2016-06-25 DIAGNOSIS — Z79891 Long term (current) use of opiate analgesic: Secondary | ICD-10-CM | POA: Diagnosis not present

## 2016-07-17 DIAGNOSIS — D485 Neoplasm of uncertain behavior of skin: Secondary | ICD-10-CM | POA: Diagnosis not present

## 2016-07-17 DIAGNOSIS — L309 Dermatitis, unspecified: Secondary | ICD-10-CM | POA: Diagnosis not present

## 2016-07-17 DIAGNOSIS — L821 Other seborrheic keratosis: Secondary | ICD-10-CM | POA: Diagnosis not present

## 2016-07-17 DIAGNOSIS — L570 Actinic keratosis: Secondary | ICD-10-CM | POA: Diagnosis not present

## 2016-07-17 DIAGNOSIS — D1801 Hemangioma of skin and subcutaneous tissue: Secondary | ICD-10-CM | POA: Diagnosis not present

## 2016-07-17 DIAGNOSIS — Z85828 Personal history of other malignant neoplasm of skin: Secondary | ICD-10-CM | POA: Diagnosis not present

## 2016-07-17 DIAGNOSIS — L814 Other melanin hyperpigmentation: Secondary | ICD-10-CM | POA: Diagnosis not present

## 2016-07-21 DIAGNOSIS — L570 Actinic keratosis: Secondary | ICD-10-CM | POA: Diagnosis not present

## 2016-07-24 DIAGNOSIS — L570 Actinic keratosis: Secondary | ICD-10-CM | POA: Diagnosis not present

## 2016-08-18 DIAGNOSIS — E7800 Pure hypercholesterolemia, unspecified: Secondary | ICD-10-CM | POA: Diagnosis not present

## 2016-08-18 DIAGNOSIS — E119 Type 2 diabetes mellitus without complications: Secondary | ICD-10-CM | POA: Diagnosis not present

## 2016-08-18 DIAGNOSIS — I10 Essential (primary) hypertension: Secondary | ICD-10-CM | POA: Diagnosis not present

## 2016-09-15 DIAGNOSIS — I10 Essential (primary) hypertension: Secondary | ICD-10-CM | POA: Diagnosis not present

## 2016-09-15 DIAGNOSIS — E119 Type 2 diabetes mellitus without complications: Secondary | ICD-10-CM | POA: Diagnosis not present

## 2016-09-15 DIAGNOSIS — E7800 Pure hypercholesterolemia, unspecified: Secondary | ICD-10-CM | POA: Diagnosis not present

## 2016-09-17 DIAGNOSIS — L570 Actinic keratosis: Secondary | ICD-10-CM | POA: Diagnosis not present

## 2016-10-01 DIAGNOSIS — E1165 Type 2 diabetes mellitus with hyperglycemia: Secondary | ICD-10-CM | POA: Diagnosis not present

## 2016-10-01 DIAGNOSIS — M461 Sacroiliitis, not elsewhere classified: Secondary | ICD-10-CM | POA: Diagnosis not present

## 2016-10-01 DIAGNOSIS — Z6827 Body mass index (BMI) 27.0-27.9, adult: Secondary | ICD-10-CM | POA: Diagnosis not present

## 2016-10-01 DIAGNOSIS — M1710 Unilateral primary osteoarthritis, unspecified knee: Secondary | ICD-10-CM | POA: Diagnosis not present

## 2016-10-01 DIAGNOSIS — I2510 Atherosclerotic heart disease of native coronary artery without angina pectoris: Secondary | ICD-10-CM | POA: Diagnosis not present

## 2016-10-01 DIAGNOSIS — M109 Gout, unspecified: Secondary | ICD-10-CM | POA: Diagnosis not present

## 2016-10-01 DIAGNOSIS — K219 Gastro-esophageal reflux disease without esophagitis: Secondary | ICD-10-CM | POA: Diagnosis not present

## 2016-10-01 DIAGNOSIS — M5410 Radiculopathy, site unspecified: Secondary | ICD-10-CM | POA: Diagnosis not present

## 2016-10-01 DIAGNOSIS — M25561 Pain in right knee: Secondary | ICD-10-CM | POA: Diagnosis not present

## 2016-10-01 DIAGNOSIS — R202 Paresthesia of skin: Secondary | ICD-10-CM | POA: Diagnosis not present

## 2016-10-01 DIAGNOSIS — I10 Essential (primary) hypertension: Secondary | ICD-10-CM | POA: Diagnosis not present

## 2016-10-01 DIAGNOSIS — Z299 Encounter for prophylactic measures, unspecified: Secondary | ICD-10-CM | POA: Diagnosis not present

## 2016-10-01 DIAGNOSIS — N400 Enlarged prostate without lower urinary tract symptoms: Secondary | ICD-10-CM | POA: Diagnosis not present

## 2016-10-01 DIAGNOSIS — E7800 Pure hypercholesterolemia, unspecified: Secondary | ICD-10-CM | POA: Diagnosis not present

## 2016-10-09 DIAGNOSIS — M48061 Spinal stenosis, lumbar region without neurogenic claudication: Secondary | ICD-10-CM | POA: Diagnosis not present

## 2016-10-09 DIAGNOSIS — G3189 Other specified degenerative diseases of nervous system: Secondary | ICD-10-CM | POA: Diagnosis not present

## 2016-10-09 DIAGNOSIS — M5410 Radiculopathy, site unspecified: Secondary | ICD-10-CM | POA: Diagnosis not present

## 2016-10-15 DIAGNOSIS — M5416 Radiculopathy, lumbar region: Secondary | ICD-10-CM | POA: Diagnosis not present

## 2016-10-15 DIAGNOSIS — M169 Osteoarthritis of hip, unspecified: Secondary | ICD-10-CM | POA: Diagnosis not present

## 2016-12-01 DIAGNOSIS — I10 Essential (primary) hypertension: Secondary | ICD-10-CM | POA: Diagnosis not present

## 2016-12-01 DIAGNOSIS — E7800 Pure hypercholesterolemia, unspecified: Secondary | ICD-10-CM | POA: Diagnosis not present

## 2016-12-01 DIAGNOSIS — E119 Type 2 diabetes mellitus without complications: Secondary | ICD-10-CM | POA: Diagnosis not present

## 2017-01-06 DIAGNOSIS — Z299 Encounter for prophylactic measures, unspecified: Secondary | ICD-10-CM | POA: Diagnosis not present

## 2017-01-06 DIAGNOSIS — E7800 Pure hypercholesterolemia, unspecified: Secondary | ICD-10-CM | POA: Diagnosis not present

## 2017-01-06 DIAGNOSIS — I10 Essential (primary) hypertension: Secondary | ICD-10-CM | POA: Diagnosis not present

## 2017-01-06 DIAGNOSIS — Z6827 Body mass index (BMI) 27.0-27.9, adult: Secondary | ICD-10-CM | POA: Diagnosis not present

## 2017-01-06 DIAGNOSIS — E1165 Type 2 diabetes mellitus with hyperglycemia: Secondary | ICD-10-CM | POA: Diagnosis not present

## 2017-01-06 DIAGNOSIS — I440 Atrioventricular block, first degree: Secondary | ICD-10-CM | POA: Diagnosis not present

## 2017-01-06 DIAGNOSIS — I2510 Atherosclerotic heart disease of native coronary artery without angina pectoris: Secondary | ICD-10-CM | POA: Diagnosis not present

## 2017-01-06 DIAGNOSIS — Z713 Dietary counseling and surveillance: Secondary | ICD-10-CM | POA: Diagnosis not present

## 2017-01-27 DIAGNOSIS — E7800 Pure hypercholesterolemia, unspecified: Secondary | ICD-10-CM | POA: Diagnosis not present

## 2017-01-27 DIAGNOSIS — I10 Essential (primary) hypertension: Secondary | ICD-10-CM | POA: Diagnosis not present

## 2017-01-27 DIAGNOSIS — E119 Type 2 diabetes mellitus without complications: Secondary | ICD-10-CM | POA: Diagnosis not present

## 2017-02-11 DIAGNOSIS — Z23 Encounter for immunization: Secondary | ICD-10-CM | POA: Diagnosis not present

## 2017-02-16 DIAGNOSIS — Z6827 Body mass index (BMI) 27.0-27.9, adult: Secondary | ICD-10-CM | POA: Diagnosis not present

## 2017-02-16 DIAGNOSIS — Z299 Encounter for prophylactic measures, unspecified: Secondary | ICD-10-CM | POA: Diagnosis not present

## 2017-02-16 DIAGNOSIS — E1165 Type 2 diabetes mellitus with hyperglycemia: Secondary | ICD-10-CM | POA: Diagnosis not present

## 2017-02-16 DIAGNOSIS — E7800 Pure hypercholesterolemia, unspecified: Secondary | ICD-10-CM | POA: Diagnosis not present

## 2017-02-16 DIAGNOSIS — I10 Essential (primary) hypertension: Secondary | ICD-10-CM | POA: Diagnosis not present

## 2017-02-16 DIAGNOSIS — Z1211 Encounter for screening for malignant neoplasm of colon: Secondary | ICD-10-CM | POA: Diagnosis not present

## 2017-02-16 DIAGNOSIS — Z1339 Encounter for screening examination for other mental health and behavioral disorders: Secondary | ICD-10-CM | POA: Diagnosis not present

## 2017-02-16 DIAGNOSIS — Z7189 Other specified counseling: Secondary | ICD-10-CM | POA: Diagnosis not present

## 2017-02-16 DIAGNOSIS — Z0000 Encounter for general adult medical examination without abnormal findings: Secondary | ICD-10-CM | POA: Diagnosis not present

## 2017-02-16 DIAGNOSIS — I2510 Atherosclerotic heart disease of native coronary artery without angina pectoris: Secondary | ICD-10-CM | POA: Diagnosis not present

## 2017-02-16 DIAGNOSIS — R5383 Other fatigue: Secondary | ICD-10-CM | POA: Diagnosis not present

## 2017-02-16 DIAGNOSIS — Z1331 Encounter for screening for depression: Secondary | ICD-10-CM | POA: Diagnosis not present

## 2017-02-17 DIAGNOSIS — Z125 Encounter for screening for malignant neoplasm of prostate: Secondary | ICD-10-CM | POA: Diagnosis not present

## 2017-02-17 DIAGNOSIS — E7800 Pure hypercholesterolemia, unspecified: Secondary | ICD-10-CM | POA: Diagnosis not present

## 2017-02-17 DIAGNOSIS — Z79899 Other long term (current) drug therapy: Secondary | ICD-10-CM | POA: Diagnosis not present

## 2017-02-17 DIAGNOSIS — R5383 Other fatigue: Secondary | ICD-10-CM | POA: Diagnosis not present

## 2017-02-20 DIAGNOSIS — Z299 Encounter for prophylactic measures, unspecified: Secondary | ICD-10-CM | POA: Diagnosis not present

## 2017-02-20 DIAGNOSIS — Z6827 Body mass index (BMI) 27.0-27.9, adult: Secondary | ICD-10-CM | POA: Diagnosis not present

## 2017-02-20 DIAGNOSIS — R42 Dizziness and giddiness: Secondary | ICD-10-CM | POA: Diagnosis not present

## 2017-02-20 DIAGNOSIS — E1165 Type 2 diabetes mellitus with hyperglycemia: Secondary | ICD-10-CM | POA: Diagnosis not present

## 2017-02-20 DIAGNOSIS — I10 Essential (primary) hypertension: Secondary | ICD-10-CM | POA: Diagnosis not present

## 2017-02-25 DIAGNOSIS — R42 Dizziness and giddiness: Secondary | ICD-10-CM | POA: Diagnosis not present

## 2017-02-26 DIAGNOSIS — E7800 Pure hypercholesterolemia, unspecified: Secondary | ICD-10-CM | POA: Diagnosis not present

## 2017-02-26 DIAGNOSIS — E119 Type 2 diabetes mellitus without complications: Secondary | ICD-10-CM | POA: Diagnosis not present

## 2017-02-26 DIAGNOSIS — I10 Essential (primary) hypertension: Secondary | ICD-10-CM | POA: Diagnosis not present

## 2017-03-10 DIAGNOSIS — E119 Type 2 diabetes mellitus without complications: Secondary | ICD-10-CM | POA: Diagnosis not present

## 2017-03-10 DIAGNOSIS — E7800 Pure hypercholesterolemia, unspecified: Secondary | ICD-10-CM | POA: Diagnosis not present

## 2017-03-10 DIAGNOSIS — I10 Essential (primary) hypertension: Secondary | ICD-10-CM | POA: Diagnosis not present

## 2017-04-29 DIAGNOSIS — E119 Type 2 diabetes mellitus without complications: Secondary | ICD-10-CM | POA: Diagnosis not present

## 2017-04-29 DIAGNOSIS — I10 Essential (primary) hypertension: Secondary | ICD-10-CM | POA: Diagnosis not present

## 2017-04-29 DIAGNOSIS — E7800 Pure hypercholesterolemia, unspecified: Secondary | ICD-10-CM | POA: Diagnosis not present

## 2017-05-19 DIAGNOSIS — Z299 Encounter for prophylactic measures, unspecified: Secondary | ICD-10-CM | POA: Diagnosis not present

## 2017-05-19 DIAGNOSIS — N400 Enlarged prostate without lower urinary tract symptoms: Secondary | ICD-10-CM | POA: Diagnosis not present

## 2017-05-19 DIAGNOSIS — K219 Gastro-esophageal reflux disease without esophagitis: Secondary | ICD-10-CM | POA: Diagnosis not present

## 2017-05-19 DIAGNOSIS — E1165 Type 2 diabetes mellitus with hyperglycemia: Secondary | ICD-10-CM | POA: Diagnosis not present

## 2017-05-19 DIAGNOSIS — E7800 Pure hypercholesterolemia, unspecified: Secondary | ICD-10-CM | POA: Diagnosis not present

## 2017-05-19 DIAGNOSIS — M461 Sacroiliitis, not elsewhere classified: Secondary | ICD-10-CM | POA: Diagnosis not present

## 2017-05-19 DIAGNOSIS — Z6827 Body mass index (BMI) 27.0-27.9, adult: Secondary | ICD-10-CM | POA: Diagnosis not present

## 2017-05-19 DIAGNOSIS — Z87891 Personal history of nicotine dependence: Secondary | ICD-10-CM | POA: Diagnosis not present

## 2017-05-19 DIAGNOSIS — I10 Essential (primary) hypertension: Secondary | ICD-10-CM | POA: Diagnosis not present

## 2017-05-20 DIAGNOSIS — I10 Essential (primary) hypertension: Secondary | ICD-10-CM | POA: Diagnosis not present

## 2017-05-20 DIAGNOSIS — E7800 Pure hypercholesterolemia, unspecified: Secondary | ICD-10-CM | POA: Diagnosis not present

## 2017-05-20 DIAGNOSIS — E119 Type 2 diabetes mellitus without complications: Secondary | ICD-10-CM | POA: Diagnosis not present

## 2017-06-11 DIAGNOSIS — Z299 Encounter for prophylactic measures, unspecified: Secondary | ICD-10-CM | POA: Diagnosis not present

## 2017-06-11 DIAGNOSIS — I2510 Atherosclerotic heart disease of native coronary artery without angina pectoris: Secondary | ICD-10-CM | POA: Diagnosis not present

## 2017-06-11 DIAGNOSIS — R197 Diarrhea, unspecified: Secondary | ICD-10-CM | POA: Diagnosis not present

## 2017-06-11 DIAGNOSIS — Z6827 Body mass index (BMI) 27.0-27.9, adult: Secondary | ICD-10-CM | POA: Diagnosis not present

## 2017-06-11 DIAGNOSIS — I10 Essential (primary) hypertension: Secondary | ICD-10-CM | POA: Diagnosis not present

## 2017-06-11 DIAGNOSIS — E1165 Type 2 diabetes mellitus with hyperglycemia: Secondary | ICD-10-CM | POA: Diagnosis not present

## 2017-07-27 DIAGNOSIS — Z6827 Body mass index (BMI) 27.0-27.9, adult: Secondary | ICD-10-CM | POA: Diagnosis not present

## 2017-07-27 DIAGNOSIS — E7800 Pure hypercholesterolemia, unspecified: Secondary | ICD-10-CM | POA: Diagnosis not present

## 2017-07-27 DIAGNOSIS — I2510 Atherosclerotic heart disease of native coronary artery without angina pectoris: Secondary | ICD-10-CM | POA: Diagnosis not present

## 2017-07-27 DIAGNOSIS — E1165 Type 2 diabetes mellitus with hyperglycemia: Secondary | ICD-10-CM | POA: Diagnosis not present

## 2017-07-27 DIAGNOSIS — Z299 Encounter for prophylactic measures, unspecified: Secondary | ICD-10-CM | POA: Diagnosis not present

## 2017-07-27 DIAGNOSIS — M25512 Pain in left shoulder: Secondary | ICD-10-CM | POA: Diagnosis not present

## 2017-07-27 DIAGNOSIS — R197 Diarrhea, unspecified: Secondary | ICD-10-CM | POA: Diagnosis not present

## 2017-07-27 DIAGNOSIS — I10 Essential (primary) hypertension: Secondary | ICD-10-CM | POA: Diagnosis not present

## 2017-08-04 ENCOUNTER — Ambulatory Visit (INDEPENDENT_AMBULATORY_CARE_PROVIDER_SITE_OTHER): Payer: Medicare Other | Admitting: Internal Medicine

## 2017-08-04 ENCOUNTER — Encounter (INDEPENDENT_AMBULATORY_CARE_PROVIDER_SITE_OTHER): Payer: Self-pay | Admitting: Internal Medicine

## 2017-08-04 VITALS — BP 160/80 | HR 68 | Temp 97.6°F | Ht 69.0 in | Wt 178.8 lb

## 2017-08-04 DIAGNOSIS — K921 Melena: Secondary | ICD-10-CM | POA: Diagnosis not present

## 2017-08-04 DIAGNOSIS — R197 Diarrhea, unspecified: Secondary | ICD-10-CM | POA: Diagnosis not present

## 2017-08-04 DIAGNOSIS — R195 Other fecal abnormalities: Secondary | ICD-10-CM

## 2017-08-04 HISTORY — DX: Diarrhea, unspecified: R19.7

## 2017-08-04 NOTE — Progress Notes (Addendum)
Subjective:    Patient ID: Wesley Garrison, male    DOB: 1924/05/27, 84 y.o.   MRN: [id number]  HPI Referred by Dr. Woody Seller for diarrhea. Has had diarrhea for about 3 weeks. Hs been taking Lomotil which has not helped. There has been no blood in his stool. Noted in February temp 99.2.. He says he is still having having diarrhea. He is having one BM a day. Stools continue to be loose. He has been taking Metamucil which has helped.  There has been no weight loss. Appetite is good. He has not been on any recent antibiotics. He says his stools are soft now since taking the Metamucil. He has a lot of gas. He says his stools are black. Sometimes he is incontinent.   His lat colonoscopy was in October of 2014 for heme positive stool card which revealed moderate number of diverticula in sigmoid colon. Normal rectal mucosa. Hemorrhoids above and below the dentate line.   Family hx negative for CRC.  Diabetic x 15 yrs,. Review of Systems Past Medical History:  Diagnosis Date  . Arthritis   . Diarrhea 08/04/2017  . DM type 2 (diabetes mellitus, type 2) ([place])    for at least 10 years  . GERD (gastroesophageal reflux disease)   . Gout   . History of kidney stones   . HOH (hard of hearing)   . Hypertension     Past Surgical History:  Procedure Laterality Date  . ANKLE SURGERY Right 1961   removal of ankle bone  . CATARACT EXTRACTION W/PHACO  03/11/2012   Procedure: CATARACT EXTRACTION PHACO AND INTRAOCULAR LENS PLACEMENT (IOC);  Surgeon: Tonny Branch, MD;  Location: [facility];  Service: Ophthalmology;  Laterality: Left;  CDE 11.09  . COLONOSCOPY    . COLONOSCOPY N/A 03/01/2013   Procedure: COLONOSCOPY;  Surgeon: Rogene Houston, MD;  Location: [facility];  Service: Endoscopy;  Laterality: N/A;  730  . EYE SURGERY    . spinal cyst  1946  . TOTAL HIP ARTHROPLASTY Right 12/22/2013   Procedure: RIGHT TOTAL HIP ARTHROPLASTY ANTERIOR APPROACH;  Surgeon: Gearlean Alf, MD;  Location: [facility];  Service:  Orthopedics;  Laterality: Right;    No Known Allergies  Current Outpatient Medications on File Prior to Visit  Medication Sig Dispense Refill  . amLODipine (NORVASC) 5 MG tablet Take 5 mg by mouth daily.    Marland Kitchen aspirin EC 81 MG tablet Take 81 mg by mouth daily.    Marland Kitchen atorvastatin (LIPITOR) 20 MG tablet Take 1 tablet by mouth daily.    . diphenoxylate-atropine (LOMOTIL) 2.5-0.025 MG tablet Take by mouth 4 (four) times daily as needed for diarrhea or loose stools.    . fluticasone (FLONASE) 50 MCG/ACT nasal spray Place into both nostrils daily.    Marland Kitchen GLIPIZIDE XL 5 MG 24 hr tablet Take 2 tablets by mouth 2 (two) times daily.    Marland Kitchen JANUVIA 100 MG tablet Take 1 tablet by mouth daily.    Marland Kitchen lisinopril (PRINIVIL,ZESTRIL) 20 MG tablet Take 20 mg by mouth 2 (two) times daily.    . metFORMIN (GLUCOPHAGE) 500 MG tablet Take 500 mg by mouth 2 (two) times daily with a meal.     . metoprolol (LOPRESSOR) 50 MG tablet Take 50 mg by mouth 2 (two) times daily.     Marland Kitchen oxyCODONE-Acetaminophen ER 7.5-325 MG TBCR Take by mouth.    . pantoprazole (PROTONIX) 40 MG tablet Take 40 mg by mouth every morning.    Marland Kitchen  Tamsulosin HCl (FLOMAX) 0.4 MG CAPS Take 0.4 mg by mouth 2 (two) times daily.      Current Facility-Administered Medications on File Prior to Visit  Medication Dose Route Frequency Provider Last Rate Last Dose  . tranexamic acid (CYKLOKAPRON) 2,000 mg in sodium chloride 0.9 % 50 mL Topical Application  8,850 mg Topical Once [place], [employer], PA-C            Objective:   Physical Exam Blood pressure (!) 160/80, pulse 68, temperature 97.6 F (36.4 C), height 5\' 9"  (1.753 m), weight 178 lb 12.8 oz (81.1 kg). Alert and oriented. Skin warm and dry. Oral mucosa is moist.   . Sclera anicteric, conjunctivae is pink. Thyroid not enlarged. No cervical lymphadenopathy. Lungs clear. Heart regular rate and rhythm.  Abdomen is soft. Bowel sounds are positive. No hepatomegaly. No abdominal masses felt. No tenderness.  No  edema to lower extremities. Stool brown and guaiac positive.          Assessment & Plan:  Diarrhea which is better. Stool was guaiac positive. CBC and GI pathogen. Will discuss with Dr Laural Golden, possible colonoscopy.

## 2017-08-04 NOTE — Patient Instructions (Signed)
CBC and GI pathogen.

## 2017-08-05 DIAGNOSIS — R197 Diarrhea, unspecified: Secondary | ICD-10-CM | POA: Diagnosis not present

## 2017-08-05 DIAGNOSIS — R195 Other fecal abnormalities: Secondary | ICD-10-CM | POA: Diagnosis not present

## 2017-08-05 DIAGNOSIS — K921 Melena: Secondary | ICD-10-CM | POA: Diagnosis not present

## 2017-08-07 LAB — GASTROINTESTINAL PATHOGEN PANEL PCR
C. difficile Tox A/B, PCR: NOT DETECTED
CAMPYLOBACTER, PCR: NOT DETECTED
Cryptosporidium, PCR: NOT DETECTED
E coli (ETEC) LT/ST PCR: NOT DETECTED
E coli (STEC) stx1/stx2, PCR: NOT DETECTED
E coli 0157, PCR: NOT DETECTED
GIARDIA LAMBLIA, PCR: NOT DETECTED
NOROVIRUS, PCR: NOT DETECTED
ROTAVIRUS, PCR: NOT DETECTED
SHIGELLA, PCR: NOT DETECTED
Salmonella, PCR: NOT DETECTED

## 2017-08-07 LAB — CBC WITH DIFFERENTIAL/PLATELET
BASOS ABS: 47 {cells}/uL (ref 0–200)
Basophils Relative: 0.6 %
EOS PCT: 2.3 %
Eosinophils Absolute: 179 cells/uL (ref 15–500)
HCT: 41 % (ref 38.5–50.0)
Hemoglobin: 13.8 g/dL (ref 13.2–17.1)
Lymphs Abs: 2699 cells/uL (ref 850–3900)
MCH: 29.1 pg (ref 27.0–33.0)
MCHC: 33.7 g/dL (ref 32.0–36.0)
MCV: 86.5 fL (ref 80.0–100.0)
MONOS PCT: 9.1 %
MPV: 11.1 fL (ref 7.5–12.5)
NEUTROS PCT: 53.4 %
Neutro Abs: 4165 cells/uL (ref 1500–7800)
Platelets: 181 10*3/uL (ref 140–400)
RBC: 4.74 10*6/uL (ref 4.20–5.80)
RDW: 12.7 % (ref 11.0–15.0)
Total Lymphocyte: 34.6 %
WBC mixed population: 710 cells/uL (ref 200–950)
WBC: 7.8 10*3/uL (ref 3.8–10.8)

## 2017-08-10 ENCOUNTER — Telehealth (INDEPENDENT_AMBULATORY_CARE_PROVIDER_SITE_OTHER): Payer: Self-pay | Admitting: Internal Medicine

## 2017-08-10 NOTE — Telephone Encounter (Signed)
I have spoken with patient. Will discuss with Dr. Laural Golden

## 2017-08-10 NOTE — Telephone Encounter (Signed)
Patient called wanted to speak with you - please call at ph# [phone]

## 2017-08-12 ENCOUNTER — Telehealth (INDEPENDENT_AMBULATORY_CARE_PROVIDER_SITE_OTHER): Payer: Self-pay | Admitting: Internal Medicine

## 2017-08-12 ENCOUNTER — Other Ambulatory Visit (INDEPENDENT_AMBULATORY_CARE_PROVIDER_SITE_OTHER): Payer: Self-pay | Admitting: Internal Medicine

## 2017-08-12 DIAGNOSIS — R197 Diarrhea, unspecified: Secondary | ICD-10-CM

## 2017-08-12 DIAGNOSIS — K921 Melena: Secondary | ICD-10-CM

## 2017-08-12 NOTE — Telephone Encounter (Signed)
[name], EGD 

## 2017-08-12 NOTE — Telephone Encounter (Signed)
Patient will keep a stool diary for 2 weeks. I discussed with Dr. Laural Golden. EGD. CBC is normal. GI pathogen is negative

## 2017-08-13 ENCOUNTER — Encounter (INDEPENDENT_AMBULATORY_CARE_PROVIDER_SITE_OTHER): Payer: Self-pay | Admitting: *Deleted

## 2017-08-13 DIAGNOSIS — K921 Melena: Secondary | ICD-10-CM | POA: Insufficient documentation

## 2017-08-13 NOTE — Telephone Encounter (Signed)
EGD sch'd 09/10/17 at 245 (145), patient's wife aware, instructions mailed

## 2017-08-25 DIAGNOSIS — Z299 Encounter for prophylactic measures, unspecified: Secondary | ICD-10-CM | POA: Diagnosis not present

## 2017-08-25 DIAGNOSIS — E1165 Type 2 diabetes mellitus with hyperglycemia: Secondary | ICD-10-CM | POA: Diagnosis not present

## 2017-08-25 DIAGNOSIS — M545 Low back pain: Secondary | ICD-10-CM | POA: Diagnosis not present

## 2017-08-25 DIAGNOSIS — I10 Essential (primary) hypertension: Secondary | ICD-10-CM | POA: Diagnosis not present

## 2017-08-25 DIAGNOSIS — Z6827 Body mass index (BMI) 27.0-27.9, adult: Secondary | ICD-10-CM | POA: Diagnosis not present

## 2017-09-10 ENCOUNTER — Encounter (HOSPITAL_COMMUNITY)
Admission: RE | Disposition: A | Discharge status: Home / Self Care | Payer: Self-pay | Source: Ambulatory Visit | Attending: Internal Medicine

## 2017-09-10 ENCOUNTER — Ambulatory Visit (HOSPITAL_COMMUNITY)
Admission: RE | Admit: 2017-09-10 | Discharge: 2017-09-10 | Disposition: A | Discharge status: Home / Self Care | Payer: Medicare Other | Source: Ambulatory Visit | Attending: Internal Medicine | Admitting: Internal Medicine

## 2017-09-10 ENCOUNTER — Other Ambulatory Visit: Payer: Self-pay

## 2017-09-10 ENCOUNTER — Encounter (HOSPITAL_COMMUNITY): Payer: Self-pay

## 2017-09-10 DIAGNOSIS — K921 Melena: Secondary | ICD-10-CM | POA: Diagnosis not present

## 2017-09-10 DIAGNOSIS — K228 Other specified diseases of esophagus: Secondary | ICD-10-CM | POA: Insufficient documentation

## 2017-09-10 DIAGNOSIS — I10 Essential (primary) hypertension: Secondary | ICD-10-CM | POA: Diagnosis not present

## 2017-09-10 DIAGNOSIS — R1314 Dysphagia, pharyngoesophageal phase: Secondary | ICD-10-CM | POA: Diagnosis not present

## 2017-09-10 DIAGNOSIS — Z79899 Other long term (current) drug therapy: Secondary | ICD-10-CM | POA: Insufficient documentation

## 2017-09-10 DIAGNOSIS — Z7984 Long term (current) use of oral hypoglycemic drugs: Secondary | ICD-10-CM | POA: Insufficient documentation

## 2017-09-10 DIAGNOSIS — Z96641 Presence of right artificial hip joint: Secondary | ICD-10-CM | POA: Diagnosis not present

## 2017-09-10 DIAGNOSIS — R197 Diarrhea, unspecified: Secondary | ICD-10-CM

## 2017-09-10 DIAGNOSIS — Z7982 Long term (current) use of aspirin: Secondary | ICD-10-CM | POA: Insufficient documentation

## 2017-09-10 DIAGNOSIS — E119 Type 2 diabetes mellitus without complications: Secondary | ICD-10-CM | POA: Diagnosis not present

## 2017-09-10 HISTORY — PX: ESOPHAGOGASTRODUODENOSCOPY: SHX5428

## 2017-09-10 LAB — GLUCOSE, CAPILLARY: GLUCOSE-CAPILLARY: 108 mg/dL — AB (ref 65–99)

## 2017-09-10 LAB — HEMOGLOBIN AND HEMATOCRIT, BLOOD
HCT: 41.1 % (ref 39.0–52.0)
Hemoglobin: 13.5 g/dL (ref 13.0–17.0)

## 2017-09-10 SURGERY — EGD (ESOPHAGOGASTRODUODENOSCOPY)
Anesthesia: Moderate Sedation

## 2017-09-10 MED ORDER — STERILE WATER FOR IRRIGATION IR SOLN
Status: DC | PRN
  Administered 2017-09-10: 100 mL

## 2017-09-10 MED ORDER — SODIUM CHLORIDE 0.9 % IV SOLN
INTRAVENOUS | Status: DC
  Administered 2017-09-10: 20 mL/h via INTRAVENOUS

## 2017-09-10 MED ORDER — LIDOCAINE VISCOUS 2 % MT SOLN
OROMUCOSAL | Status: AC
  Filled 2017-09-10: qty 15

## 2017-09-10 MED ORDER — MIDAZOLAM HCL 5 MG/5ML IJ SOLN
INTRAMUSCULAR | Status: DC | PRN
  Administered 2017-09-10 (×2): 1 mg via INTRAVENOUS

## 2017-09-10 MED ORDER — MIDAZOLAM HCL 5 MG/5ML IJ SOLN
INTRAMUSCULAR | Status: AC
  Filled 2017-09-10: qty 10

## 2017-09-10 MED ORDER — MEPERIDINE HCL 50 MG/ML IJ SOLN
INTRAMUSCULAR | Status: DC | PRN
  Administered 2017-09-10: 15 mg via INTRAVENOUS
  Administered 2017-09-10: 10 mg via INTRAVENOUS

## 2017-09-10 MED ORDER — MEPERIDINE HCL 50 MG/ML IJ SOLN
INTRAMUSCULAR | Status: AC
  Filled 2017-09-10: qty 1

## 2017-09-10 NOTE — Discharge Instructions (Signed)
Resume usual medications and diet. Will ask Dr. Woody Seller to stop metformin if possible. Try taking diphenoxylate 1 tablet every morning and subsequent doses on as-needed basis Will check H&H today.   Physician will call with results and further recommendations.  Esophagogastroduodenoscopy, Care After Refer to this sheet in the next few weeks. These instructions provide you with information about caring for yourself after your procedure. Your health care provider may also give you more specific instructions. Your treatment has been planned according to current medical practices, but problems sometimes occur. Call your health care provider if you have any problems or questions after your procedure.  Dr Laural Golden: [phone].  After hours and weekends call the hospital and have the GI doctor on call paged; they will call you back. What can I expect after the procedure? After the procedure, it is common to have:  A sore throat.  Nausea.  Bloating.  Dizziness.  Fatigue.  Follow these instructions at home:  Do not eat or drink anything until the numbing medicine (local anesthetic) has worn off and your gag reflex has returned. You will know that the local anesthetic has worn off when you can swallow comfortably.  Do not drive for 24 hours if you received a medicine to help you relax (sedative).  If your health care provider took a tissue sample for testing during the procedure, make sure to get your test results. This is your responsibility. Ask your health care provider or the department performing the test when your results will be ready.  Keep all follow-up visits as told by your health care provider. This is important. Contact a health care provider if:  You cannot stop coughing. Get help right away if:  You have trouble swallowing.  You cannot eat or drink.  You have throat or chest pain that gets worse.  You have nausea or vomiting.  You have chills.  You have a fever.  You  have severe abdominal pain.  You have black, tarry, or bloody stools. This information is not intended to replace advice given to you by your health care provider. Make sure you discuss any questions you have with your health care provider. Document Released: 04/07/2012 Document Revised: 09/27/2015 Document Reviewed: 03/15/2015 Elsevier Interactive Patient Education  [employer].

## 2017-09-10 NOTE — Op Note (Signed)
[facility] Patient Name: Wesley Garrison Procedure Date: 09/10/2017 2:35 PM MRN: [id number] Date of Birth: 04/03/1925 Attending MD: Hildred Laser , MD CSN: [id number] Age: 84 Admit Type: Outpatient Procedure:                Upper GI endoscopy Indications:              Melena Providers:                Hildred Laser, MD, Rosina Lowenstein, RN, Aram Candela Referring MD:             Glenda Chroman, MD Medicines:                Lidocaine spray, Meperidine 25 mg IV, Midazolam 2                            mg IV Complications:            No immediate complications. Estimated Blood Loss:     Estimated blood loss: none. Procedure:                Pre-Anesthesia Assessment:                           - Prior to the procedure, a History and Physical                            was performed, and patient medications and                            allergies were reviewed. The patient's tolerance of                            previous anesthesia was also reviewed. The risks                            and benefits of the procedure and the sedation                            options and risks were discussed with the patient.                            All questions were answered, and informed consent                            was obtained. Prior Anticoagulants: The patient                            last took aspirin 2 days prior to the procedure.                            ASA Grade Assessment: III - A patient with severe                            systemic disease. After reviewing the risks and  benefits, the patient was deemed in satisfactory                            condition to undergo the procedure.                           After obtaining informed consent, the endoscope was                            passed under direct vision. Throughout the                            procedure, the patient's blood pressure, pulse, and                            oxygen saturations  were monitored continuously. The                            [id number] ([id number]) scope was introduced through the                            mouth, and advanced to the second part of duodenum.                            The upper GI endoscopy was accomplished without                            difficulty. The patient tolerated the procedure                            well. Scope In: 3:11:04 PM Scope Out: 3:15:41 PM Total Procedure Duration: 0 hours 4 minutes 37 seconds  Findings:      The examined esophagus was normal.      The Z-line was irregular and was found 40 cm from the incisors.      The entire examined stomach was normal.      The duodenal bulb and second portion of the duodenum were normal. Impression:               - Normal esophagus.                           - Z-line irregular, 40 cm from the incisors.                           - Normal stomach.                           - Normal duodenal bulb and second portion of the                            duodenum.                           - No specimens collected. Moderate Sedation:      Moderate (conscious) sedation was administered by the endoscopy  nurse       and supervised by the endoscopist. The following parameters were       monitored: oxygen saturation, heart rate, blood pressure, CO2       capnography and response to care. Total physician intraservice time was       9 minutes. Recommendation:           - Patient has a contact number available for                            emergencies. The signs and symptoms of potential                            delayed complications were discussed with the                            patient. Return to normal activities tomorrow.                            Written discharge instructions were provided to the                            patient.                           - Resume previous diet today.                           - Continue present medications.                           -  Resume aspirin at prior dose today.                           - H/H will be checked today.                           - Will ask Dr. Woody Seller to stop Metformin as it might                            be causing diarrhea. Procedure Code(s):        --- Professional ---                           [phone], Esophagogastroduodenoscopy, flexible,                            transoral; diagnostic, including collection of                            specimen(s) by brushing or washing, when performed                            (separate procedure) Diagnosis Code(s):        --- Professional ---  K22.8, Other specified diseases of esophagus                           K92.1, Melena (includes Hematochezia) CPT copyright 2017 American Medical Association. All rights reserved. The codes documented in this report are preliminary and upon coder review may  be revised to meet current compliance requirements. Hildred Laser, MD Hildred Laser, MD 09/10/2017 3:26:20 PM This report has been signed electronically. Number of Addenda: 0

## 2017-09-10 NOTE — H&P (Signed)
Wesley Garrison is an 84 y.o. male.   Chief Complaint: Patient is here for EGD and possible esophageal dilation. HPI: Patient is a 84 year old Caucasian male who was referred to our office for evaluation of few week history of diarrhea.  He had been on Lomotil but it did not help.  He has been having 3 stools per day.  GI pathogen panel was negative.  Patient also reported tarry stools which she has had for over 4 weeks.Marland Kitchen  His stool was heme positive in the office.  His H&H however was normal.  He says he has not had a good appetite for 1 week but he has not lost any weight.  He also complains of intermittent solid food dysphagia and has had sporadic regurgitation for relief.  He denies abdominal pain or rectal bleeding. He is on low-dose aspirin.  He does not take any other OTC NSAIDs.  Past Medical History:  Diagnosis Date  . Arthritis   . Diarrhea 08/04/2017  . DM type 2 (diabetes mellitus, type 2) ([place])    for at least 10 years  . GERD (gastroesophageal reflux disease)   . Gout   . History of kidney stones   . HOH (hard of hearing)   . Hypertension     Past Surgical History:  Procedure Laterality Date  . ANKLE SURGERY Right 1961   removal of ankle bone  . CATARACT EXTRACTION W/PHACO  03/11/2012   Procedure: CATARACT EXTRACTION PHACO AND INTRAOCULAR LENS PLACEMENT (IOC);  Surgeon: Tonny Branch, MD;  Location: [facility];  Service: Ophthalmology;  Laterality: Left;  CDE 11.09  . COLONOSCOPY    . COLONOSCOPY N/A 03/01/2013   Procedure: COLONOSCOPY;  Surgeon: Rogene Houston, MD;  Location: [facility];  Service: Endoscopy;  Laterality: N/A;  730  . EYE SURGERY    . JOINT REPLACEMENT Bilateral    RIght hip 12-2013 and bilateral [id number]  . spinal cyst  1946  . TOTAL HIP ARTHROPLASTY Right 12/22/2013   Procedure: RIGHT TOTAL HIP ARTHROPLASTY ANTERIOR APPROACH;  Surgeon: Gearlean Alf, MD;  Location: [facility];  Service: Orthopedics;  Laterality: Right;    Family History  Problem Relation  Age of Onset  . Diabetes Mother    Social History:  reports that he quit smoking about 69 years ago. His smoking use included cigarettes. He started smoking about 74 years ago. He quit after 2.00 years of use. He quit smokeless tobacco use about 15 years ago. His smokeless tobacco use included chew. He reports that he does not drink alcohol or use drugs.  Allergies: No Known Allergies  Medications Prior to Admission  Medication Sig Dispense Refill  . amLODipine (NORVASC) 5 MG tablet Take 5 mg by mouth daily.    Marland Kitchen aspirin EC 81 MG tablet Take 162 mg by mouth daily.     Marland Kitchen atorvastatin (LIPITOR) 20 MG tablet Take 20 mg by mouth daily.     . diphenoxylate-atropine (LOMOTIL) 2.5-0.025 MG tablet Take 1 tablet by mouth 4 (four) times daily as needed for diarrhea or loose stools.     . fluticasone (FLONASE) 50 MCG/ACT nasal spray Place 2 sprays into both nostrils daily as needed for allergies.     Marland Kitchen GLIPIZIDE XL 5 MG 24 hr tablet Take 5-10 mg by mouth See admin instructions. Take 10 mg by mouth in the morning and take 5 mg by mouth in the evening    . JANUVIA 100 MG tablet Take 100 mg by mouth daily.     Marland Kitchen  lisinopril (PRINIVIL,ZESTRIL) 20 MG tablet Take 20 mg by mouth 2 (two) times daily.    . metFORMIN (GLUCOPHAGE) 500 MG tablet Take 500 mg by mouth 2 (two) times daily with a meal.     . metoprolol (LOPRESSOR) 50 MG tablet Take 50 mg by mouth 2 (two) times daily.     . pantoprazole (PROTONIX) 40 MG tablet Take 40 mg by mouth every morning.    . Tamsulosin HCl (FLOMAX) 0.4 MG CAPS Take 0.4 mg by mouth 2 (two) times daily.     . TURMERIC PO Take 1 capsule by mouth daily.      Results for orders placed or performed during the hospital encounter of 09/10/17 (from the past 48 hour(s))  Glucose, capillary     Status: Abnormal   Collection Time: 09/10/17  1:58 PM  Result Value Ref Range   Glucose-Capillary 108 (H) 65 - 99 mg/dL   No results found.  ROS  Blood pressure (!) 173/70, pulse (!) 47,  temperature 97.7 F (36.5 C), temperature source Oral, resp. rate 17, height 5\' 9"  (1.753 m), weight 179 lb (81.2 kg), SpO2 99 %. Physical Exam  Constitutional: He appears well-developed and well-nourished.  HENT:  Mouth/Throat: Oropharynx is clear and moist.  Eyes: Conjunctivae are normal. No scleral icterus.  Neck: No thyromegaly present.  Cardiovascular: Normal rate, regular rhythm and normal heart sounds.  No murmur heard. Respiratory: Effort normal and breath sounds normal.  GI: Soft. He exhibits no distension and no mass. There is no tenderness.  Musculoskeletal: He exhibits no edema.  Lymphadenopathy:    He has no cervical adenopathy.  Neurological: He is alert.  Skin: Skin is warm and dry.     Assessment/Plan Melena and esophageal dysphagia. Esophagogastroduodenoscopy with possible esophageal dilation.  Hildred Laser, MD 09/10/2017, 3:01 PM

## 2017-09-11 DIAGNOSIS — M545 Low back pain: Secondary | ICD-10-CM | POA: Diagnosis not present

## 2017-09-11 DIAGNOSIS — R197 Diarrhea, unspecified: Secondary | ICD-10-CM | POA: Diagnosis not present

## 2017-09-11 DIAGNOSIS — Z299 Encounter for prophylactic measures, unspecified: Secondary | ICD-10-CM | POA: Diagnosis not present

## 2017-09-11 DIAGNOSIS — E1165 Type 2 diabetes mellitus with hyperglycemia: Secondary | ICD-10-CM | POA: Diagnosis not present

## 2017-09-11 DIAGNOSIS — I10 Essential (primary) hypertension: Secondary | ICD-10-CM | POA: Diagnosis not present

## 2017-09-11 DIAGNOSIS — Z6827 Body mass index (BMI) 27.0-27.9, adult: Secondary | ICD-10-CM | POA: Diagnosis not present

## 2017-09-14 DIAGNOSIS — E7800 Pure hypercholesterolemia, unspecified: Secondary | ICD-10-CM | POA: Diagnosis not present

## 2017-09-14 DIAGNOSIS — I10 Essential (primary) hypertension: Secondary | ICD-10-CM | POA: Diagnosis not present

## 2017-09-14 DIAGNOSIS — E119 Type 2 diabetes mellitus without complications: Secondary | ICD-10-CM | POA: Diagnosis not present

## 2017-09-16 ENCOUNTER — Encounter (HOSPITAL_COMMUNITY): Payer: Self-pay | Admitting: Internal Medicine

## 2017-09-16 DIAGNOSIS — M19012 Primary osteoarthritis, left shoulder: Secondary | ICD-10-CM | POA: Diagnosis not present

## 2017-09-16 DIAGNOSIS — M7061 Trochanteric bursitis, right hip: Secondary | ICD-10-CM | POA: Diagnosis not present

## 2017-09-16 DIAGNOSIS — M719 Bursopathy, unspecified: Secondary | ICD-10-CM | POA: Diagnosis not present

## 2017-09-16 DIAGNOSIS — Z96641 Presence of right artificial hip joint: Secondary | ICD-10-CM | POA: Diagnosis not present

## 2017-09-16 DIAGNOSIS — M67919 Unspecified disorder of synovium and tendon, unspecified shoulder: Secondary | ICD-10-CM | POA: Diagnosis not present

## 2017-09-16 DIAGNOSIS — Z96653 Presence of artificial knee joint, bilateral: Secondary | ICD-10-CM | POA: Diagnosis not present

## 2017-09-22 DIAGNOSIS — L739 Follicular disorder, unspecified: Secondary | ICD-10-CM | POA: Diagnosis not present

## 2017-09-22 DIAGNOSIS — D485 Neoplasm of uncertain behavior of skin: Secondary | ICD-10-CM | POA: Diagnosis not present

## 2017-09-22 DIAGNOSIS — L708 Other acne: Secondary | ICD-10-CM | POA: Diagnosis not present

## 2017-09-22 DIAGNOSIS — L439 Lichen planus, unspecified: Secondary | ICD-10-CM | POA: Diagnosis not present

## 2017-09-22 DIAGNOSIS — Z85828 Personal history of other malignant neoplasm of skin: Secondary | ICD-10-CM | POA: Diagnosis not present

## 2017-09-22 DIAGNOSIS — L570 Actinic keratosis: Secondary | ICD-10-CM | POA: Diagnosis not present

## 2017-09-24 DIAGNOSIS — L439 Lichen planus, unspecified: Secondary | ICD-10-CM | POA: Diagnosis not present

## 2017-10-28 DIAGNOSIS — M67919 Unspecified disorder of synovium and tendon, unspecified shoulder: Secondary | ICD-10-CM | POA: Diagnosis not present

## 2017-10-28 DIAGNOSIS — M7061 Trochanteric bursitis, right hip: Secondary | ICD-10-CM | POA: Diagnosis not present

## 2017-10-28 DIAGNOSIS — Z96641 Presence of right artificial hip joint: Secondary | ICD-10-CM | POA: Diagnosis not present

## 2017-11-12 DIAGNOSIS — H2511 Age-related nuclear cataract, right eye: Secondary | ICD-10-CM | POA: Diagnosis not present

## 2017-11-12 DIAGNOSIS — E119 Type 2 diabetes mellitus without complications: Secondary | ICD-10-CM | POA: Diagnosis not present

## 2017-11-12 DIAGNOSIS — H04123 Dry eye syndrome of bilateral lacrimal glands: Secondary | ICD-10-CM | POA: Diagnosis not present

## 2017-11-12 DIAGNOSIS — Z7984 Long term (current) use of oral hypoglycemic drugs: Secondary | ICD-10-CM | POA: Diagnosis not present

## 2017-11-23 DIAGNOSIS — L570 Actinic keratosis: Secondary | ICD-10-CM | POA: Diagnosis not present

## 2017-11-23 DIAGNOSIS — L820 Inflamed seborrheic keratosis: Secondary | ICD-10-CM | POA: Diagnosis not present

## 2017-12-01 DIAGNOSIS — Z6825 Body mass index (BMI) 25.0-25.9, adult: Secondary | ICD-10-CM | POA: Diagnosis not present

## 2017-12-01 DIAGNOSIS — M461 Sacroiliitis, not elsewhere classified: Secondary | ICD-10-CM | POA: Diagnosis not present

## 2017-12-01 DIAGNOSIS — I10 Essential (primary) hypertension: Secondary | ICD-10-CM | POA: Diagnosis not present

## 2017-12-01 DIAGNOSIS — Z299 Encounter for prophylactic measures, unspecified: Secondary | ICD-10-CM | POA: Diagnosis not present

## 2017-12-01 DIAGNOSIS — E1165 Type 2 diabetes mellitus with hyperglycemia: Secondary | ICD-10-CM | POA: Diagnosis not present

## 2017-12-14 DIAGNOSIS — E119 Type 2 diabetes mellitus without complications: Secondary | ICD-10-CM | POA: Diagnosis not present

## 2017-12-14 DIAGNOSIS — E7800 Pure hypercholesterolemia, unspecified: Secondary | ICD-10-CM | POA: Diagnosis not present

## 2017-12-14 DIAGNOSIS — I10 Essential (primary) hypertension: Secondary | ICD-10-CM | POA: Diagnosis not present

## 2017-12-23 DIAGNOSIS — Z6826 Body mass index (BMI) 26.0-26.9, adult: Secondary | ICD-10-CM | POA: Diagnosis not present

## 2017-12-23 DIAGNOSIS — I10 Essential (primary) hypertension: Secondary | ICD-10-CM | POA: Diagnosis not present

## 2017-12-23 DIAGNOSIS — Z299 Encounter for prophylactic measures, unspecified: Secondary | ICD-10-CM | POA: Diagnosis not present

## 2017-12-23 DIAGNOSIS — R42 Dizziness and giddiness: Secondary | ICD-10-CM | POA: Diagnosis not present

## 2017-12-23 DIAGNOSIS — R197 Diarrhea, unspecified: Secondary | ICD-10-CM | POA: Diagnosis not present

## 2017-12-23 DIAGNOSIS — I2510 Atherosclerotic heart disease of native coronary artery without angina pectoris: Secondary | ICD-10-CM | POA: Diagnosis not present

## 2018-01-26 DIAGNOSIS — E119 Type 2 diabetes mellitus without complications: Secondary | ICD-10-CM | POA: Diagnosis not present

## 2018-01-26 DIAGNOSIS — E7800 Pure hypercholesterolemia, unspecified: Secondary | ICD-10-CM | POA: Diagnosis not present

## 2018-01-26 DIAGNOSIS — I10 Essential (primary) hypertension: Secondary | ICD-10-CM | POA: Diagnosis not present

## 2018-02-22 DIAGNOSIS — Z299 Encounter for prophylactic measures, unspecified: Secondary | ICD-10-CM | POA: Diagnosis not present

## 2018-02-22 DIAGNOSIS — E7800 Pure hypercholesterolemia, unspecified: Secondary | ICD-10-CM | POA: Diagnosis not present

## 2018-02-22 DIAGNOSIS — Z7189 Other specified counseling: Secondary | ICD-10-CM | POA: Diagnosis not present

## 2018-02-22 DIAGNOSIS — M1990 Unspecified osteoarthritis, unspecified site: Secondary | ICD-10-CM | POA: Diagnosis not present

## 2018-02-22 DIAGNOSIS — Z0000 Encounter for general adult medical examination without abnormal findings: Secondary | ICD-10-CM | POA: Diagnosis not present

## 2018-02-22 DIAGNOSIS — Z1211 Encounter for screening for malignant neoplasm of colon: Secondary | ICD-10-CM | POA: Diagnosis not present

## 2018-02-22 DIAGNOSIS — Z125 Encounter for screening for malignant neoplasm of prostate: Secondary | ICD-10-CM | POA: Diagnosis not present

## 2018-02-22 DIAGNOSIS — R5383 Other fatigue: Secondary | ICD-10-CM | POA: Diagnosis not present

## 2018-02-22 DIAGNOSIS — Z6827 Body mass index (BMI) 27.0-27.9, adult: Secondary | ICD-10-CM | POA: Diagnosis not present

## 2018-02-22 DIAGNOSIS — Z1339 Encounter for screening examination for other mental health and behavioral disorders: Secondary | ICD-10-CM | POA: Diagnosis not present

## 2018-02-22 DIAGNOSIS — Z1331 Encounter for screening for depression: Secondary | ICD-10-CM | POA: Diagnosis not present

## 2018-02-22 DIAGNOSIS — R42 Dizziness and giddiness: Secondary | ICD-10-CM | POA: Diagnosis not present

## 2018-02-22 DIAGNOSIS — Z79899 Other long term (current) drug therapy: Secondary | ICD-10-CM | POA: Diagnosis not present

## 2018-02-22 DIAGNOSIS — I10 Essential (primary) hypertension: Secondary | ICD-10-CM | POA: Diagnosis not present

## 2018-02-26 DIAGNOSIS — I700 Atherosclerosis of aorta: Secondary | ICD-10-CM | POA: Diagnosis not present

## 2018-02-26 DIAGNOSIS — M47817 Spondylosis without myelopathy or radiculopathy, lumbosacral region: Secondary | ICD-10-CM | POA: Diagnosis not present

## 2018-02-26 DIAGNOSIS — Z299 Encounter for prophylactic measures, unspecified: Secondary | ICD-10-CM | POA: Diagnosis not present

## 2018-02-26 DIAGNOSIS — I10 Essential (primary) hypertension: Secondary | ICD-10-CM | POA: Diagnosis not present

## 2018-02-26 DIAGNOSIS — E1165 Type 2 diabetes mellitus with hyperglycemia: Secondary | ICD-10-CM | POA: Diagnosis not present

## 2018-02-26 DIAGNOSIS — S79911A Unspecified injury of right hip, initial encounter: Secondary | ICD-10-CM | POA: Diagnosis not present

## 2018-02-26 DIAGNOSIS — Z96641 Presence of right artificial hip joint: Secondary | ICD-10-CM | POA: Diagnosis not present

## 2018-02-26 DIAGNOSIS — M25551 Pain in right hip: Secondary | ICD-10-CM | POA: Diagnosis not present

## 2018-02-26 DIAGNOSIS — S3992XA Unspecified injury of lower back, initial encounter: Secondary | ICD-10-CM | POA: Diagnosis not present

## 2018-02-26 DIAGNOSIS — M545 Low back pain: Secondary | ICD-10-CM | POA: Diagnosis not present

## 2018-02-26 DIAGNOSIS — E7800 Pure hypercholesterolemia, unspecified: Secondary | ICD-10-CM | POA: Diagnosis not present

## 2018-02-26 DIAGNOSIS — Z6826 Body mass index (BMI) 26.0-26.9, adult: Secondary | ICD-10-CM | POA: Diagnosis not present

## 2018-03-12 DIAGNOSIS — E119 Type 2 diabetes mellitus without complications: Secondary | ICD-10-CM | POA: Diagnosis not present

## 2018-03-12 DIAGNOSIS — I10 Essential (primary) hypertension: Secondary | ICD-10-CM | POA: Diagnosis not present

## 2018-03-12 DIAGNOSIS — E7800 Pure hypercholesterolemia, unspecified: Secondary | ICD-10-CM | POA: Diagnosis not present

## 2018-03-25 DIAGNOSIS — M61051 Myositis ossificans traumatica, right thigh: Secondary | ICD-10-CM | POA: Diagnosis not present

## 2018-03-25 DIAGNOSIS — Z6826 Body mass index (BMI) 26.0-26.9, adult: Secondary | ICD-10-CM | POA: Diagnosis not present

## 2018-03-25 DIAGNOSIS — I10 Essential (primary) hypertension: Secondary | ICD-10-CM | POA: Diagnosis not present

## 2018-03-25 DIAGNOSIS — Z299 Encounter for prophylactic measures, unspecified: Secondary | ICD-10-CM | POA: Diagnosis not present

## 2018-03-25 DIAGNOSIS — E1165 Type 2 diabetes mellitus with hyperglycemia: Secondary | ICD-10-CM | POA: Diagnosis not present

## 2018-03-29 DIAGNOSIS — Z6826 Body mass index (BMI) 26.0-26.9, adult: Secondary | ICD-10-CM | POA: Diagnosis not present

## 2018-03-29 DIAGNOSIS — H6120 Impacted cerumen, unspecified ear: Secondary | ICD-10-CM | POA: Diagnosis not present

## 2018-03-29 DIAGNOSIS — Z299 Encounter for prophylactic measures, unspecified: Secondary | ICD-10-CM | POA: Diagnosis not present

## 2018-03-29 DIAGNOSIS — E1165 Type 2 diabetes mellitus with hyperglycemia: Secondary | ICD-10-CM | POA: Diagnosis not present

## 2018-03-29 DIAGNOSIS — I10 Essential (primary) hypertension: Secondary | ICD-10-CM | POA: Diagnosis not present

## 2018-04-13 DIAGNOSIS — H81393 Other peripheral vertigo, bilateral: Secondary | ICD-10-CM | POA: Diagnosis not present

## 2018-04-13 DIAGNOSIS — E1140 Type 2 diabetes mellitus with diabetic neuropathy, unspecified: Secondary | ICD-10-CM | POA: Diagnosis not present

## 2018-04-13 DIAGNOSIS — E1159 Type 2 diabetes mellitus with other circulatory complications: Secondary | ICD-10-CM | POA: Diagnosis not present

## 2018-04-23 DIAGNOSIS — E119 Type 2 diabetes mellitus without complications: Secondary | ICD-10-CM | POA: Diagnosis not present

## 2018-04-23 DIAGNOSIS — I10 Essential (primary) hypertension: Secondary | ICD-10-CM | POA: Diagnosis not present

## 2018-04-23 DIAGNOSIS — E7800 Pure hypercholesterolemia, unspecified: Secondary | ICD-10-CM | POA: Diagnosis not present

## 2018-04-26 DIAGNOSIS — I10 Essential (primary) hypertension: Secondary | ICD-10-CM | POA: Diagnosis not present

## 2018-04-26 DIAGNOSIS — Z299 Encounter for prophylactic measures, unspecified: Secondary | ICD-10-CM | POA: Diagnosis not present

## 2018-04-26 DIAGNOSIS — Z6826 Body mass index (BMI) 26.0-26.9, adult: Secondary | ICD-10-CM | POA: Diagnosis not present

## 2018-04-26 DIAGNOSIS — R42 Dizziness and giddiness: Secondary | ICD-10-CM | POA: Diagnosis not present

## 2018-04-26 DIAGNOSIS — E1165 Type 2 diabetes mellitus with hyperglycemia: Secondary | ICD-10-CM | POA: Diagnosis not present

## 2018-05-17 DIAGNOSIS — M25561 Pain in right knee: Secondary | ICD-10-CM | POA: Diagnosis not present

## 2018-05-17 DIAGNOSIS — Z96653 Presence of artificial knee joint, bilateral: Secondary | ICD-10-CM | POA: Diagnosis not present

## 2018-06-01 DIAGNOSIS — E7800 Pure hypercholesterolemia, unspecified: Secondary | ICD-10-CM | POA: Diagnosis not present

## 2018-06-01 DIAGNOSIS — I10 Essential (primary) hypertension: Secondary | ICD-10-CM | POA: Diagnosis not present

## 2018-06-01 DIAGNOSIS — E119 Type 2 diabetes mellitus without complications: Secondary | ICD-10-CM | POA: Diagnosis not present

## 2018-06-17 ENCOUNTER — Encounter: Payer: Self-pay | Admitting: Cardiovascular Disease

## 2018-06-17 ENCOUNTER — Ambulatory Visit (INDEPENDENT_AMBULATORY_CARE_PROVIDER_SITE_OTHER): Payer: Medicare Other | Admitting: Cardiovascular Disease

## 2018-06-17 VITALS — BP 151/65 | HR 52 | Ht 69.0 in | Wt 178.0 lb

## 2018-06-17 DIAGNOSIS — E785 Hyperlipidemia, unspecified: Secondary | ICD-10-CM

## 2018-06-17 DIAGNOSIS — I10 Essential (primary) hypertension: Secondary | ICD-10-CM

## 2018-06-17 DIAGNOSIS — R001 Bradycardia, unspecified: Secondary | ICD-10-CM | POA: Diagnosis not present

## 2018-06-17 DIAGNOSIS — I340 Nonrheumatic mitral (valve) insufficiency: Secondary | ICD-10-CM | POA: Diagnosis not present

## 2018-06-17 DIAGNOSIS — I25118 Atherosclerotic heart disease of native coronary artery with other forms of angina pectoris: Secondary | ICD-10-CM | POA: Diagnosis not present

## 2018-06-17 DIAGNOSIS — R42 Dizziness and giddiness: Secondary | ICD-10-CM

## 2018-06-17 MED ORDER — METOPROLOL TARTRATE 25 MG PO TABS: 25.0000 mg | ORAL_TABLET | Freq: Two times a day (BID) | ORAL | 6 refills | Status: DC

## 2018-06-17 NOTE — Patient Instructions (Addendum)
Medication Instructions:   Decrease Lopressor to 25mg  twice a day.  Continue all other medications.    Labwork:   Testing/Procedures:   Follow-Up: 1 year   Any Other Special Instructions Will Be Listed Below (If Applicable).  If you need a refill on your cardiac medications before your next appointment, please call your pharmacy.

## 2018-06-17 NOTE — Progress Notes (Signed)
SUBJECTIVE: The patient presents for past due follow-up.  He has a history of coronary artery disease and mitral regurgitation.  He had an echocardiogram in 2014 performed for dyspnea which showed normal LV systolic function, moderate mitral regurgitation, mild aortic regurgitation and mild pulmonary hypertension. He had a CT of the chest which showed atherosclerosis and calcifications of the RCA and LAD.   He had a pharmacologic nuclear stress test in 07/2012 which showed mild partially reversible anteroseptal and inferior defects with normal EF, with no high risk features.   ECG performed in the office today which I ordered and personally interpreted demonstrates sinus bradycardia with first-degree AV block, 52 bpm, PR 230 ms, with diffuse nonspecific T wave abnormalities.  The patient denies any symptoms of chest pain, palpitations, shortness of breath, leg swelling, orthopnea, PND, and syncope.    Review of Systems: As per "subjective", otherwise negative.  No Known Allergies  Current Outpatient Medications  Medication Sig Dispense Refill  . amLODipine (NORVASC) 5 MG tablet Take 5 mg by mouth daily.    Marland Kitchen aspirin EC 81 MG tablet Take 162 mg by mouth daily.     Marland Kitchen atorvastatin (LIPITOR) 20 MG tablet Take 20 mg by mouth daily.     . diphenoxylate-atropine (LOMOTIL) 2.5-0.025 MG tablet Take 1 tablet by mouth 4 (four) times daily as needed for diarrhea or loose stools.     . fluticasone (FLONASE) 50 MCG/ACT nasal spray Place 2 sprays into both nostrils daily as needed for allergies.     Marland Kitchen GLIPIZIDE XL 5 MG 24 hr tablet Take 5-10 mg by mouth See admin instructions. Take 10 mg by mouth in the morning and take 5 mg by mouth in the evening    . JANUVIA 100 MG tablet Take 100 mg by mouth daily.     Marland Kitchen lisinopril (PRINIVIL,ZESTRIL) 20 MG tablet Take 20 mg by mouth 2 (two) times daily.    . metoprolol (LOPRESSOR) 50 MG tablet Take 50 mg by mouth 2 (two) times daily.     . pantoprazole  (PROTONIX) 40 MG tablet Take 40 mg by mouth every morning.    . Tamsulosin HCl (FLOMAX) 0.4 MG CAPS Take 0.4 mg by mouth 2 (two) times daily.     . TURMERIC PO Take 1 capsule by mouth daily.     No current facility-administered medications for this visit.    Facility-Administered Medications Ordered in Other Visits  Medication Dose Route Frequency Provider Last Rate Last Dose  . tranexamic acid (CYKLOKAPRON) 2,000 mg in sodium chloride 0.9 % 50 mL Topical Application  7,741 mg Topical Once Ardeen Jourdain, PA-C        Past Medical History:  Diagnosis Date  . Arthritis   . Diarrhea 08/04/2017  . DM type 2 (diabetes mellitus, type 2) ([place])    for at least 10 years  . GERD (gastroesophageal reflux disease)   . Gout   . History of kidney stones   . HOH (hard of hearing)   . Hypertension     Past Surgical History:  Procedure Laterality Date  . ANKLE SURGERY Right 1961   removal of ankle bone  . CATARACT EXTRACTION W/PHACO  03/11/2012   Procedure: CATARACT EXTRACTION PHACO AND INTRAOCULAR LENS PLACEMENT (IOC);  Surgeon: Tonny Branch, MD;  Location: [facility];  Service: Ophthalmology;  Laterality: Left;  CDE 11.09  . COLONOSCOPY    . COLONOSCOPY N/A 03/01/2013   Procedure: COLONOSCOPY;  Surgeon: Rogene Houston, MD;  Location: [facility];  Service: Endoscopy;  Laterality: N/A;  730  . ESOPHAGOGASTRODUODENOSCOPY N/A 09/10/2017   Procedure: ESOPHAGOGASTRODUODENOSCOPY (EGD);  Surgeon: Rogene Houston, MD;  Location: [facility];  Service: Endoscopy;  Laterality: N/A;  245  . EYE SURGERY    . JOINT REPLACEMENT Bilateral    RIght hip 12-2013 and bilateral [id number]  . spinal cyst  1946  . TOTAL HIP ARTHROPLASTY Right 12/22/2013   Procedure: RIGHT TOTAL HIP ARTHROPLASTY ANTERIOR APPROACH;  Surgeon: Gearlean Alf, MD;  Location: [facility];  Service: Orthopedics;  Laterality: Right;    Social History   Socioeconomic History  . Marital status: Married    Spouse name: Not on file  . Number  of children: Not on file  . Years of education: Not on file  . Highest education level: Not on file  Occupational History  . Not on file  Social Needs  . Financial resource strain: Not on file  . Food insecurity:    Worry: Not on file    Inability: Not on file  . Transportation needs:    Medical: Not on file    Non-medical: Not on file  Tobacco Use  . Smoking status: Former Smoker    Years: 2.00    Types: Cigarettes    Start date: 02/22/1943    Last attempt to quit: 05/05/1948    Years since quitting: 70.1  . Smokeless tobacco: Former Systems developer    Types: Chew    Quit date: 05/05/2002  . Tobacco comment: used to chew tobacco, age 50   Substance and Sexual Activity  . Alcohol use: No    Alcohol/week: 0.0 standard drinks  . Drug use: No  . Sexual activity: Yes    Birth control/protection: None  Lifestyle  . Physical activity:    Days per week: Not on file    Minutes per session: Not on file  . Stress: Not on file  Relationships  . Social connections:    Talks on phone: Not on file    Gets together: Not on file    Attends religious service: Not on file    Active member of club or organization: Not on file    Attends meetings of clubs or organizations: Not on file    Relationship status: Not on file  . Intimate partner violence:    Fear of current or ex partner: Not on file    Emotionally abused: Not on file    Physically abused: Not on file    Forced sexual activity: Not on file  Other Topics Concern  . Not on file  Social History Narrative  . Not on file     Vitals:   06/17/18 1330  BP: (!) 151/65  Pulse: (!) 52  SpO2: 98%  Weight: 178 lb (80.7 kg)  Height: 5\' 9"  (1.753 m)    Wt Readings from Last 3 Encounters:  06/17/18 178 lb (80.7 kg)  09/10/17 179 lb (81.2 kg)  08/04/17 178 lb 12.8 oz (81.1 kg)     PHYSICAL EXAM General: NAD HEENT: Normal. Neck: No JVD, no thyromegaly. Lungs: Clear to auscultation bilaterally with normal respiratory effort. CV:  Bradycardic, regular rhythm, normal S1/S2, no S3/S4, no murmur. No pretibial or periankle edema.  No carotid bruit.   Abdomen: Soft, nontender, no distention.  Neurologic: Alert and oriented.  Psych: Normal affect. Skin: Normal. Musculoskeletal: No gross deformities.    ECG: Reviewed above under Subjective   Labs: Lab Results  Component Value Date/Time  K 4.2 12/24/2013 05:00 AM   BUN 11 12/24/2013 05:00 AM   CREATININE 0.61 12/24/2013 05:00 AM   ALT 20 12/14/2013 01:00 PM   HGB 13.5 09/10/2017 03:50 PM     Lipids: No results found for: LDLCALC, LDLDIRECT, CHOL, TRIG, HDL     ASSESSMENT AND PLAN:  1. CAD: Stable ischemic heart disease. Continue ASA 81 mg daily and Lipitor. Mildly abnormal stress test in 07/2012 without high risk features and evidence of coronary calcifications on CT scan. Due to his age, poor functional capacity, and no convincing symptoms of angina, I think continued medical therapy is the best option in his situation. Due to bradycardia, I will reduce Lopressor to 25 mg twice daily.  2. Mitral regurgitation: Moderate when last checked and asymptomatic from this standpoint. No murmur appreciated today.  3. Essential HTN:  BP is elevated today.  His wife says it is usually normal.  I will monitor given reduction of Lopressor to 25 mg twice daily.  4. Hyperlipidemia: Continue Lipitor.  5.  Dizziness and bradycardia: ECG reviewed above.  Heart rate 52 bpm.  I will reduce Lopressor to 25 mg twice daily.   Disposition: Follow up 1 year or sooner should problems arise   Kate Sable, M.D., F.A.C.C.

## 2018-06-18 ENCOUNTER — Telehealth: Payer: Self-pay | Admitting: Cardiovascular Disease

## 2018-06-18 MED ORDER — METOPROLOL TARTRATE 25 MG PO TABS: 25.0000 mg | ORAL_TABLET | Freq: Two times a day (BID) | ORAL | 3 refills | Status: DC

## 2018-06-18 NOTE — Telephone Encounter (Signed)
Patient called stating that he needs his Lopressor 25 mg submitted to [facility].

## 2018-06-18 NOTE — Telephone Encounter (Signed)
Done

## 2018-06-30 DIAGNOSIS — Z299 Encounter for prophylactic measures, unspecified: Secondary | ICD-10-CM | POA: Diagnosis not present

## 2018-06-30 DIAGNOSIS — H6090 Unspecified otitis externa, unspecified ear: Secondary | ICD-10-CM | POA: Diagnosis not present

## 2018-06-30 DIAGNOSIS — I10 Essential (primary) hypertension: Secondary | ICD-10-CM | POA: Diagnosis not present

## 2018-06-30 DIAGNOSIS — E1165 Type 2 diabetes mellitus with hyperglycemia: Secondary | ICD-10-CM | POA: Diagnosis not present

## 2018-06-30 DIAGNOSIS — M461 Sacroiliitis, not elsewhere classified: Secondary | ICD-10-CM | POA: Diagnosis not present

## 2018-06-30 DIAGNOSIS — I25118 Atherosclerotic heart disease of native coronary artery with other forms of angina pectoris: Secondary | ICD-10-CM | POA: Diagnosis not present

## 2018-06-30 DIAGNOSIS — Z6827 Body mass index (BMI) 27.0-27.9, adult: Secondary | ICD-10-CM | POA: Diagnosis not present

## 2018-07-13 DIAGNOSIS — H81393 Other peripheral vertigo, bilateral: Secondary | ICD-10-CM | POA: Diagnosis not present

## 2018-08-19 DIAGNOSIS — I10 Essential (primary) hypertension: Secondary | ICD-10-CM | POA: Diagnosis not present

## 2018-08-19 DIAGNOSIS — E119 Type 2 diabetes mellitus without complications: Secondary | ICD-10-CM | POA: Diagnosis not present

## 2018-08-19 DIAGNOSIS — E7800 Pure hypercholesterolemia, unspecified: Secondary | ICD-10-CM | POA: Diagnosis not present

## 2018-09-02 DIAGNOSIS — Z299 Encounter for prophylactic measures, unspecified: Secondary | ICD-10-CM | POA: Diagnosis not present

## 2018-09-02 DIAGNOSIS — M461 Sacroiliitis, not elsewhere classified: Secondary | ICD-10-CM | POA: Diagnosis not present

## 2018-09-02 DIAGNOSIS — I10 Essential (primary) hypertension: Secondary | ICD-10-CM | POA: Diagnosis not present

## 2018-09-02 DIAGNOSIS — R42 Dizziness and giddiness: Secondary | ICD-10-CM | POA: Diagnosis not present

## 2018-09-02 DIAGNOSIS — Z6826 Body mass index (BMI) 26.0-26.9, adult: Secondary | ICD-10-CM | POA: Diagnosis not present

## 2018-09-02 DIAGNOSIS — E1165 Type 2 diabetes mellitus with hyperglycemia: Secondary | ICD-10-CM | POA: Diagnosis not present

## 2018-10-01 DIAGNOSIS — I10 Essential (primary) hypertension: Secondary | ICD-10-CM | POA: Diagnosis not present

## 2018-10-12 DIAGNOSIS — I10 Essential (primary) hypertension: Secondary | ICD-10-CM | POA: Diagnosis not present

## 2018-10-12 DIAGNOSIS — I25118 Atherosclerotic heart disease of native coronary artery with other forms of angina pectoris: Secondary | ICD-10-CM | POA: Diagnosis not present

## 2018-10-12 DIAGNOSIS — Z299 Encounter for prophylactic measures, unspecified: Secondary | ICD-10-CM | POA: Diagnosis not present

## 2018-10-12 DIAGNOSIS — Z6826 Body mass index (BMI) 26.0-26.9, adult: Secondary | ICD-10-CM | POA: Diagnosis not present

## 2018-10-12 DIAGNOSIS — M461 Sacroiliitis, not elsewhere classified: Secondary | ICD-10-CM | POA: Diagnosis not present

## 2018-10-12 DIAGNOSIS — E1165 Type 2 diabetes mellitus with hyperglycemia: Secondary | ICD-10-CM | POA: Diagnosis not present

## 2018-10-19 DIAGNOSIS — I10 Essential (primary) hypertension: Secondary | ICD-10-CM | POA: Diagnosis not present

## 2018-10-19 DIAGNOSIS — Z6826 Body mass index (BMI) 26.0-26.9, adult: Secondary | ICD-10-CM | POA: Diagnosis not present

## 2018-10-19 DIAGNOSIS — R159 Full incontinence of feces: Secondary | ICD-10-CM | POA: Diagnosis not present

## 2018-10-19 DIAGNOSIS — E1165 Type 2 diabetes mellitus with hyperglycemia: Secondary | ICD-10-CM | POA: Diagnosis not present

## 2018-10-19 DIAGNOSIS — M461 Sacroiliitis, not elsewhere classified: Secondary | ICD-10-CM | POA: Diagnosis not present

## 2018-10-19 DIAGNOSIS — Z299 Encounter for prophylactic measures, unspecified: Secondary | ICD-10-CM | POA: Diagnosis not present

## 2018-10-19 DIAGNOSIS — K921 Melena: Secondary | ICD-10-CM | POA: Diagnosis not present

## 2018-10-22 DIAGNOSIS — M7061 Trochanteric bursitis, right hip: Secondary | ICD-10-CM | POA: Diagnosis not present

## 2018-10-22 DIAGNOSIS — Z96641 Presence of right artificial hip joint: Secondary | ICD-10-CM | POA: Diagnosis not present

## 2018-10-22 DIAGNOSIS — Z96653 Presence of artificial knee joint, bilateral: Secondary | ICD-10-CM | POA: Diagnosis not present

## 2018-10-25 DIAGNOSIS — M7061 Trochanteric bursitis, right hip: Secondary | ICD-10-CM | POA: Diagnosis not present

## 2018-10-28 DIAGNOSIS — E119 Type 2 diabetes mellitus without complications: Secondary | ICD-10-CM | POA: Diagnosis not present

## 2018-10-28 DIAGNOSIS — I10 Essential (primary) hypertension: Secondary | ICD-10-CM | POA: Diagnosis not present

## 2018-10-28 DIAGNOSIS — E7800 Pure hypercholesterolemia, unspecified: Secondary | ICD-10-CM | POA: Diagnosis not present

## 2018-11-01 DIAGNOSIS — I10 Essential (primary) hypertension: Secondary | ICD-10-CM | POA: Diagnosis not present

## 2018-11-02 DIAGNOSIS — Z299 Encounter for prophylactic measures, unspecified: Secondary | ICD-10-CM | POA: Diagnosis not present

## 2018-11-02 DIAGNOSIS — E1165 Type 2 diabetes mellitus with hyperglycemia: Secondary | ICD-10-CM | POA: Diagnosis not present

## 2018-11-02 DIAGNOSIS — Z6825 Body mass index (BMI) 25.0-25.9, adult: Secondary | ICD-10-CM | POA: Diagnosis not present

## 2018-11-02 DIAGNOSIS — I10 Essential (primary) hypertension: Secondary | ICD-10-CM | POA: Diagnosis not present

## 2018-11-02 DIAGNOSIS — Z713 Dietary counseling and surveillance: Secondary | ICD-10-CM | POA: Diagnosis not present

## 2018-11-22 DIAGNOSIS — E7800 Pure hypercholesterolemia, unspecified: Secondary | ICD-10-CM | POA: Diagnosis not present

## 2018-11-22 DIAGNOSIS — E119 Type 2 diabetes mellitus without complications: Secondary | ICD-10-CM | POA: Diagnosis not present

## 2018-11-22 DIAGNOSIS — I10 Essential (primary) hypertension: Secondary | ICD-10-CM | POA: Diagnosis not present

## 2018-12-01 DIAGNOSIS — I10 Essential (primary) hypertension: Secondary | ICD-10-CM | POA: Diagnosis not present

## 2018-12-30 DIAGNOSIS — E7800 Pure hypercholesterolemia, unspecified: Secondary | ICD-10-CM | POA: Diagnosis not present

## 2018-12-30 DIAGNOSIS — I10 Essential (primary) hypertension: Secondary | ICD-10-CM | POA: Diagnosis not present

## 2018-12-30 DIAGNOSIS — E119 Type 2 diabetes mellitus without complications: Secondary | ICD-10-CM | POA: Diagnosis not present

## 2019-01-03 DIAGNOSIS — I10 Essential (primary) hypertension: Secondary | ICD-10-CM | POA: Diagnosis not present

## 2019-01-19 DIAGNOSIS — I10 Essential (primary) hypertension: Secondary | ICD-10-CM | POA: Diagnosis not present

## 2019-01-19 DIAGNOSIS — E1165 Type 2 diabetes mellitus with hyperglycemia: Secondary | ICD-10-CM | POA: Diagnosis not present

## 2019-01-19 DIAGNOSIS — I25118 Atherosclerotic heart disease of native coronary artery with other forms of angina pectoris: Secondary | ICD-10-CM | POA: Diagnosis not present

## 2019-01-19 DIAGNOSIS — Z299 Encounter for prophylactic measures, unspecified: Secondary | ICD-10-CM | POA: Diagnosis not present

## 2019-01-19 DIAGNOSIS — I2510 Atherosclerotic heart disease of native coronary artery without angina pectoris: Secondary | ICD-10-CM | POA: Diagnosis not present

## 2019-01-19 DIAGNOSIS — Z6827 Body mass index (BMI) 27.0-27.9, adult: Secondary | ICD-10-CM | POA: Diagnosis not present

## 2019-01-31 DIAGNOSIS — I10 Essential (primary) hypertension: Secondary | ICD-10-CM | POA: Diagnosis not present

## 2019-02-03 DIAGNOSIS — Z23 Encounter for immunization: Secondary | ICD-10-CM | POA: Diagnosis not present

## 2019-03-01 DIAGNOSIS — Z1331 Encounter for screening for depression: Secondary | ICD-10-CM | POA: Diagnosis not present

## 2019-03-01 DIAGNOSIS — Z79899 Other long term (current) drug therapy: Secondary | ICD-10-CM | POA: Diagnosis not present

## 2019-03-01 DIAGNOSIS — I10 Essential (primary) hypertension: Secondary | ICD-10-CM | POA: Diagnosis not present

## 2019-03-01 DIAGNOSIS — E7800 Pure hypercholesterolemia, unspecified: Secondary | ICD-10-CM | POA: Diagnosis not present

## 2019-03-01 DIAGNOSIS — Z1339 Encounter for screening examination for other mental health and behavioral disorders: Secondary | ICD-10-CM | POA: Diagnosis not present

## 2019-03-01 DIAGNOSIS — Z125 Encounter for screening for malignant neoplasm of prostate: Secondary | ICD-10-CM | POA: Diagnosis not present

## 2019-03-01 DIAGNOSIS — Z6827 Body mass index (BMI) 27.0-27.9, adult: Secondary | ICD-10-CM | POA: Diagnosis not present

## 2019-03-01 DIAGNOSIS — R5383 Other fatigue: Secondary | ICD-10-CM | POA: Diagnosis not present

## 2019-03-01 DIAGNOSIS — Z299 Encounter for prophylactic measures, unspecified: Secondary | ICD-10-CM | POA: Diagnosis not present

## 2019-03-01 DIAGNOSIS — Z7189 Other specified counseling: Secondary | ICD-10-CM | POA: Diagnosis not present

## 2019-03-01 DIAGNOSIS — E1165 Type 2 diabetes mellitus with hyperglycemia: Secondary | ICD-10-CM | POA: Diagnosis not present

## 2019-03-01 DIAGNOSIS — Z0000 Encounter for general adult medical examination without abnormal findings: Secondary | ICD-10-CM | POA: Diagnosis not present

## 2019-03-01 DIAGNOSIS — Z1211 Encounter for screening for malignant neoplasm of colon: Secondary | ICD-10-CM | POA: Diagnosis not present

## 2019-03-04 DIAGNOSIS — I10 Essential (primary) hypertension: Secondary | ICD-10-CM | POA: Diagnosis not present

## 2019-03-28 DIAGNOSIS — K921 Melena: Secondary | ICD-10-CM | POA: Diagnosis not present

## 2019-03-28 DIAGNOSIS — Z299 Encounter for prophylactic measures, unspecified: Secondary | ICD-10-CM | POA: Diagnosis not present

## 2019-03-28 DIAGNOSIS — I10 Essential (primary) hypertension: Secondary | ICD-10-CM | POA: Diagnosis not present

## 2019-03-28 DIAGNOSIS — Z6827 Body mass index (BMI) 27.0-27.9, adult: Secondary | ICD-10-CM | POA: Diagnosis not present

## 2019-03-28 DIAGNOSIS — H9190 Unspecified hearing loss, unspecified ear: Secondary | ICD-10-CM | POA: Diagnosis not present

## 2019-04-13 DIAGNOSIS — E119 Type 2 diabetes mellitus without complications: Secondary | ICD-10-CM | POA: Diagnosis not present

## 2019-04-13 DIAGNOSIS — E7800 Pure hypercholesterolemia, unspecified: Secondary | ICD-10-CM | POA: Diagnosis not present

## 2019-04-13 DIAGNOSIS — I10 Essential (primary) hypertension: Secondary | ICD-10-CM | POA: Diagnosis not present

## 2019-05-04 DIAGNOSIS — I10 Essential (primary) hypertension: Secondary | ICD-10-CM | POA: Diagnosis not present

## 2019-05-10 ENCOUNTER — Encounter (INDEPENDENT_AMBULATORY_CARE_PROVIDER_SITE_OTHER): Payer: Self-pay

## 2019-05-11 ENCOUNTER — Encounter (INDEPENDENT_AMBULATORY_CARE_PROVIDER_SITE_OTHER): Payer: Self-pay | Admitting: Gastroenterology

## 2019-05-11 ENCOUNTER — Ambulatory Visit (INDEPENDENT_AMBULATORY_CARE_PROVIDER_SITE_OTHER): Payer: Medicare Other | Admitting: Gastroenterology

## 2019-05-11 ENCOUNTER — Other Ambulatory Visit: Payer: Self-pay

## 2019-05-11 VITALS — BP 169/81 | HR 116 | Temp 97.9°F | Ht 69.0 in | Wt 174.7 lb

## 2019-05-11 DIAGNOSIS — R195 Other fecal abnormalities: Secondary | ICD-10-CM

## 2019-05-11 DIAGNOSIS — K921 Melena: Secondary | ICD-10-CM

## 2019-05-11 NOTE — Patient Instructions (Signed)
Your stool was negative for blood today in the office.  I am checking your blood count and hemoglobin with lab work-I will call you with these results this week.  For diarrhea I would like you to keep a log of when you have the diarrhea and what you have had before it started to eat.  If this becomes frequent please let me know.  Make sure when you take Advil to take it with food

## 2019-05-11 NOTE — Progress Notes (Signed)
Patient profile: Wesley Garrison is a 86 y.o. male seen for evaluation of + hemoccult card. Referred by Dr. Woody Seller.    History of Present Illness: Wesley Garrison is seen today for evaluation of positive Hemoccult.  He reports his PCP collecting a stool sample positive for blood.  He denies seeing any obvious bright red blood in his stools.  He does have dark black stools 2-3 times a month, he reports using Pepto but thinks he is only using it after the dark stools begin, this is not 100% clear.  He does feel he eats a lot of red meat and wonders if this contributes to dark stools. States dark stools that can vary in consistency, can be looser or formed.  If looser stools he uses Imodium w/ good results, using 2-3 times a month. He denies any abdominal pain with dark stools. Between episodes his bowel habits are formed every 2 to 3 days and brown.  He denies any straining with stools.  Denies abdominal pain.   He has occasional GERD symptoms, takes pantoprazole 40 mg daily and feels this overall works well.  Denies any nausea vomiting or dysphagia.  Reports his appetite is fairly good, his weight is down about 4 pounds over the past year.  Feels like he does not eat as much as he used to though. Also not as active, s/p multiple joint replacements.  He does take Advil several times a week, can take on empty stomach or with food.  He denies alcohol or tobacco use.  Wt Readings from Last 3 Encounters:  05/11/19 174 lb 11.2 oz (79.2 kg)  06/17/18 178 lb (80.7 kg)  09/10/17 179 lb (81.2 kg)     Last Colonoscopy:  (done for heme positive stool) 2014-Impression:  Examination performed to cecum. Moderate sigmoid colon diverticulosis. Internal/external hemorrhoids. No evidence of polyps masses or AV malformations.   Last Endoscopy: 2019 (done for melena) - Impression:  - Normal esophagus. - Z-line irregular, 40 cm from the incisors.  - Normal stomach. - Normal duodenal bulb and second portion  of the  duodenum.  - No specimens collected.   Past Medical History:  Past Medical History:  Diagnosis Date  . Arthritis   . Diarrhea 08/04/2017  . DM type 2 (diabetes mellitus, type 2) ([place])    for at least 10 years  . GERD (gastroesophageal reflux disease)   . Gout   . History of kidney stones   . HOH (hard of hearing)   . Hypertension     Problem List: Patient Active Problem List   Diagnosis Date Noted  . Melena 08/13/2017  . Diarrhea 08/04/2017  . OA (osteoarthritis) of hip 12/22/2013  . Heme positive stool 02/21/2013  . Diabetes ([place]) 02/21/2013  . High cholesterol 02/21/2013  . Gout 02/21/2013  . Mitral regurgitation 08/27/2012  . Dyspnea 07/15/2012  . Coronary atherosclerosis of native coronary artery 07/15/2012    Past Surgical History: Past Surgical History:  Procedure Laterality Date  . ANKLE SURGERY Right 1961   removal of ankle bone  . CATARACT EXTRACTION W/PHACO  03/11/2012   Procedure: CATARACT EXTRACTION PHACO AND INTRAOCULAR LENS PLACEMENT (IOC);  Surgeon: Tonny Branch, MD;  Location: [facility];  Service: Ophthalmology;  Laterality: Left;  CDE 11.09  . COLONOSCOPY    . COLONOSCOPY N/A 03/01/2013   Procedure: COLONOSCOPY;  Surgeon: Rogene Houston, MD;  [facility];  Service: Endoscopy;  Laterality: N/A;  730  . ESOPHAGOGASTRODUODENOSCOPY N/A 09/10/2017  Procedure: ESOPHAGOGASTRODUODENOSCOPY (EGD);  Surgeon: Rogene Houston, MD;  [facility];  Service: Endoscopy;  Laterality: N/A;  245  . EYE SURGERY    . JOINT REPLACEMENT Bilateral    RIght hip 12-2013 and bilateral [id number]  . spinal cyst  1946  . TOTAL HIP ARTHROPLASTY Right 12/22/2013   Procedure: RIGHT TOTAL HIP ARTHROPLASTY ANTERIOR APPROACH;  Surgeon: Gearlean Alf, MD;  Location: [facility];  Service: Orthopedics;  Laterality: Right;    Allergies: No Known Allergies    Home Medications:  Current Outpatient Medications:  .  amLODipine (NORVASC) 5 MG tablet, Take 5 mg by  mouth daily., Disp: , Rfl:  .  aspirin EC 81 MG tablet, Take 162 mg by mouth daily. , Disp: , Rfl:  .  atorvastatin (LIPITOR) 20 MG tablet, Take 20 mg by mouth daily. , Disp: , Rfl:  .  fluticasone (FLONASE) 50 MCG/ACT nasal spray, Place 2 sprays into both nostrils daily as needed for allergies. , Disp: , Rfl:  .  GLIPIZIDE XL 5 MG 24 hr tablet, Take 5-10 mg by mouth See admin instructions. Take 10 mg by mouth in the morning and take 5 mg by mouth in the evening, Disp: , Rfl:  .  JANUVIA 100 MG tablet, Take 100 mg by mouth daily. , Disp: , Rfl:  .  lisinopril (PRINIVIL,ZESTRIL) 20 MG tablet, Take 20 mg by mouth 2 (two) times daily., Disp: , Rfl:  .  metoprolol tartrate (LOPRESSOR) 25 MG tablet, Take 1 tablet (25 mg total) by mouth 2 (two) times daily., Disp: 180 tablet, Rfl: 3 .  pantoprazole (PROTONIX) 40 MG tablet, Take 40 mg by mouth every morning., Disp: , Rfl:  .  Tamsulosin HCl (FLOMAX) 0.4 MG CAPS, Take 0.4 mg by mouth 2 (two) times daily. , Disp: , Rfl:  .  TURMERIC PO, Take 1 capsule by mouth daily., Disp: , Rfl:  .  diphenoxylate-atropine (LOMOTIL) 2.5-0.025 MG tablet, Take 1 tablet by mouth 4 (four) times daily as needed for diarrhea or loose stools. , Disp: , Rfl:  No current facility-administered medications for this visit.  Facility-Administered Medications Ordered in Other Visits:  .  tranexamic acid (CYKLOKAPRON) 2,000 mg in sodium chloride 0.9 % 50 mL Topical Application, 123XX123 mg, Topical, Once, Cecilio Asper, [employer], PA-C   Family History: family history includes Diabetes in his mother.   Reports father passed from motor vehicle accident.  Mother had pneumonia and diabetes.  Denies family history of colon polyps colon cancer in his family.  Social History:   reports that he quit smoking about 71 years ago. His smoking use included cigarettes. He started smoking about 76 years ago. He quit after 2.00 years of use. He quit smokeless tobacco use about 17 years ago.  His smokeless  tobacco use included chew. He reports that he does not drink alcohol or use drugs.   Review of Systems: Constitutional: Denies weight loss/weight gain  Eyes: No changes in vision. ENT: No oral lesions, sore throat.  GI: see HPI.  Heme/Lymph: No easy bruising.  CV: No chest pain.  GU: No hematuria.  Integumentary: No rashes.  Neuro: No headaches.  Psych: No depression/anxiety.  Endocrine: No heat/cold intolerance.  Allergic/Immunologic: No urticaria.  Resp: No cough, SOB.  Musculoskeletal: + Joint pain    Physical Examination: BP (!) 169/81 (BP Location: Right Arm, Patient Position: Sitting, Cuff Size: Large)   Pulse (!) 116   Temp 97.9 F (36.6 C) (Temporal)   Ht 5'  9" (1.753 m)   Wt 174 lb 11.2 oz (79.2 kg)   BMI 25.80 kg/m  Gen: NAD, alert and oriented x 4 HEENT: PEERLA, EOMI, Neck: supple, no JVD Chest: CTA bilaterally, no wheezes, crackles, or other adventitious sounds CV: RRR, no m/g/c/r Abd: soft, NT, ND, +BS in all four quadrants; no HSM, guarding, ridigity, or rebound tenderness RECTAL - brown stool hemoccult negative  Ext: no edema, well perfused with 2+ pulses, Skin: no rash or lesions noted on observed skin Lymph: no noted LAD  BP elevated-patient states he has blood pressure cuff at home and monitors this daily and has been normal   Data: Labs included in referral -October 2020-positive Hemoccult exam, CMP normal except glucose 174, CBC with normal hemoglobin 14.4, MCV 87, platelet 186.  Assessment/Plan: Mr. Iadarola is a 86 y.o. male  Sears was seen today for follow-up.  Diagnoses and all orders for this visit:  Fecal occult blood test positive -     CBC with Differential -     Fe+TIBC+Fer  Melena     1.  Positive Hemoccult-on hemoccult at PCP, no obvious blood in stool, does have some reported melena but unclear if this is specifically before or after Pepto use. Hemoccult negative with brown stool in office today. Had hemoglobin normal October  2020 but will repeat cbc and iron panel today to exclude significant GI bleeding. He had an endoscopy in 2019 for melena that was normal. Colonoscopy in 2014 for +hemoccult also normal. He has no specific GI alarm symptoms and if hemoglobin is normal will likely hold off on endoscopic eval at his age of 27, will discuss with Dr. Laural Golden.  2.  Diarrhea-given very episodic may be related to food triggers, he will keep a food diet log.  Uses Imodium as needed which works well.  No abdominal pain. To notify me if becomes frequent. Otherwise BM q2-3 days formed.   3.  GERD-well-controlled on PPI once a day.  Reviewed avoiding NSAIDs on empty stomach.  Will contact patient w/ lab results    I personally performed the service, non-incident to. (WP)  Laurine Blazer, [facility]

## 2019-05-12 LAB — CBC WITH DIFFERENTIAL/PLATELET
Absolute Monocytes: 730 cells/uL (ref 200–950)
Basophils Absolute: 42 cells/uL (ref 0–200)
Basophils Relative: 0.5 %
Eosinophils Absolute: 66 cells/uL (ref 15–500)
Eosinophils Relative: 0.8 %
HCT: 42.2 % (ref 38.5–50.0)
Hemoglobin: 14.2 g/dL (ref 13.2–17.1)
Lymphs Abs: 2224 cells/uL (ref 850–3900)
MCH: 30 pg (ref 27.0–33.0)
MCHC: 33.6 g/dL (ref 32.0–36.0)
MCV: 89.2 fL (ref 80.0–100.0)
MPV: 11.4 fL (ref 7.5–12.5)
Monocytes Relative: 8.8 %
Neutro Abs: 5237 cells/uL (ref 1500–7800)
Neutrophils Relative %: 63.1 %
Platelets: 171 10*3/uL (ref 140–400)
RBC: 4.73 10*6/uL (ref 4.20–5.80)
RDW: 12.8 % (ref 11.0–15.0)
Total Lymphocyte: 26.8 %
WBC: 8.3 10*3/uL (ref 3.8–10.8)

## 2019-05-12 LAB — IRON,TIBC AND FERRITIN PANEL
%SAT: 22 % (calc) (ref 20–48)
Ferritin: 137 ng/mL (ref 24–380)
Iron: 67 ug/dL (ref 50–180)
TIBC: 301 mcg/dL (calc) (ref 250–425)

## 2019-05-30 DIAGNOSIS — E119 Type 2 diabetes mellitus without complications: Secondary | ICD-10-CM | POA: Diagnosis not present

## 2019-05-30 DIAGNOSIS — E7800 Pure hypercholesterolemia, unspecified: Secondary | ICD-10-CM | POA: Diagnosis not present

## 2019-05-30 DIAGNOSIS — I10 Essential (primary) hypertension: Secondary | ICD-10-CM | POA: Diagnosis not present

## 2019-06-01 DIAGNOSIS — I10 Essential (primary) hypertension: Secondary | ICD-10-CM | POA: Diagnosis not present

## 2019-06-17 DIAGNOSIS — Z23 Encounter for immunization: Secondary | ICD-10-CM | POA: Diagnosis not present

## 2019-07-03 DIAGNOSIS — I10 Essential (primary) hypertension: Secondary | ICD-10-CM | POA: Diagnosis not present

## 2019-07-04 DIAGNOSIS — E7800 Pure hypercholesterolemia, unspecified: Secondary | ICD-10-CM | POA: Diagnosis not present

## 2019-07-04 DIAGNOSIS — I10 Essential (primary) hypertension: Secondary | ICD-10-CM | POA: Diagnosis not present

## 2019-07-04 DIAGNOSIS — E119 Type 2 diabetes mellitus without complications: Secondary | ICD-10-CM | POA: Diagnosis not present

## 2019-08-02 DIAGNOSIS — I10 Essential (primary) hypertension: Secondary | ICD-10-CM | POA: Diagnosis not present

## 2019-08-09 DIAGNOSIS — M545 Low back pain: Secondary | ICD-10-CM | POA: Diagnosis not present

## 2019-08-09 DIAGNOSIS — N400 Enlarged prostate without lower urinary tract symptoms: Secondary | ICD-10-CM | POA: Diagnosis not present

## 2019-08-09 DIAGNOSIS — Z87891 Personal history of nicotine dependence: Secondary | ICD-10-CM | POA: Diagnosis not present

## 2019-08-09 DIAGNOSIS — Z299 Encounter for prophylactic measures, unspecified: Secondary | ICD-10-CM | POA: Diagnosis not present

## 2019-08-09 DIAGNOSIS — I10 Essential (primary) hypertension: Secondary | ICD-10-CM | POA: Diagnosis not present

## 2019-08-09 DIAGNOSIS — E1165 Type 2 diabetes mellitus with hyperglycemia: Secondary | ICD-10-CM | POA: Diagnosis not present

## 2019-08-12 ENCOUNTER — Encounter: Payer: Self-pay | Admitting: Cardiovascular Disease

## 2019-08-12 ENCOUNTER — Other Ambulatory Visit: Payer: Self-pay

## 2019-08-12 ENCOUNTER — Ambulatory Visit (INDEPENDENT_AMBULATORY_CARE_PROVIDER_SITE_OTHER): Payer: Medicare Other | Admitting: Cardiovascular Disease

## 2019-08-12 VITALS — BP 142/60 | HR 66 | Ht 69.0 in | Wt 176.0 lb

## 2019-08-12 DIAGNOSIS — E785 Hyperlipidemia, unspecified: Secondary | ICD-10-CM

## 2019-08-12 DIAGNOSIS — R001 Bradycardia, unspecified: Secondary | ICD-10-CM | POA: Diagnosis not present

## 2019-08-12 DIAGNOSIS — I25118 Atherosclerotic heart disease of native coronary artery with other forms of angina pectoris: Secondary | ICD-10-CM

## 2019-08-12 DIAGNOSIS — I340 Nonrheumatic mitral (valve) insufficiency: Secondary | ICD-10-CM

## 2019-08-12 DIAGNOSIS — R42 Dizziness and giddiness: Secondary | ICD-10-CM

## 2019-08-12 DIAGNOSIS — I10 Essential (primary) hypertension: Secondary | ICD-10-CM

## 2019-08-12 MED ORDER — METOPROLOL TARTRATE 25 MG PO TABS: 12.5000 mg | ORAL_TABLET | Freq: Two times a day (BID) | ORAL | Status: DC

## 2019-08-12 NOTE — Progress Notes (Signed)
SUBJECTIVE: The patient presents for routine follow-up.  He has a history of coronary artery disease and mitral regurgitation.  He had an echocardiogram in 2014 performed for dyspnea which showed normal LV systolic function, moderate mitral regurgitation, mild aortic regurgitation and mild pulmonary hypertension. He had a CT of the chest which showed atherosclerosis and calcifications of the RCA and LAD.   He had a pharmacologic nuclear stress test in 07/2012 which showed mild partially reversible anteroseptal and inferior defects with normal EF, with no high risk features.   He denies chest pain, palpitations, and shortness of breath.  He seldom has dizziness.  I personally reviewed ECG performed today which demonstrates sinus bradycardia, 57 bpm, first-degree AV block, PR interval 220 ms, and T wave inversions inferiorly and V4 through V6.  I compared this to the ECG performed on 05/27/2016 and findings are similar.    Review of Systems: As per "subjective", otherwise negative.  No Known Allergies  Current Outpatient Medications  Medication Sig Dispense Refill  . amLODipine (NORVASC) 5 MG tablet Take 5 mg by mouth daily.    Marland Kitchen aspirin EC 81 MG tablet Take 162 mg by mouth daily.     Marland Kitchen atorvastatin (LIPITOR) 20 MG tablet Take 20 mg by mouth daily.     . diphenoxylate-atropine (LOMOTIL) 2.5-0.025 MG tablet Take 1 tablet by mouth 4 (four) times daily as needed for diarrhea or loose stools.     . fluticasone (FLONASE) 50 MCG/ACT nasal spray Place 2 sprays into both nostrils daily as needed for allergies.     Marland Kitchen GLIPIZIDE XL 5 MG 24 hr tablet Take 5-10 mg by mouth See admin instructions. Take 10 mg by mouth in the morning and take 5 mg by mouth in the evening    . JANUVIA 100 MG tablet Take 100 mg by mouth daily.     Marland Kitchen lisinopril (PRINIVIL,ZESTRIL) 20 MG tablet Take 20 mg by mouth 2 (two) times daily.    . metoprolol tartrate (LOPRESSOR) 25 MG tablet Take 1 tablet (25 mg total) by mouth  2 (two) times daily. 180 tablet 3  . pantoprazole (PROTONIX) 40 MG tablet Take 40 mg by mouth every morning.    . Tamsulosin HCl (FLOMAX) 0.4 MG CAPS Take 0.4 mg by mouth 2 (two) times daily.     . TURMERIC PO Take 1 capsule by mouth daily.     No current facility-administered medications for this visit.   Facility-Administered Medications Ordered in Other Visits  Medication Dose Route Frequency Provider Last Rate Last Admin  . tranexamic acid (CYKLOKAPRON) 2,000 mg in sodium chloride 0.9 % 50 mL Topical Application  123XX123 mg Topical Once Ardeen Jourdain, PA-C        Past Medical History:  Diagnosis Date  . Arthritis   . Diarrhea 08/04/2017  . DM type 2 (diabetes mellitus, type 2) ([place])    for at least 10 years  . GERD (gastroesophageal reflux disease)   . Gout   . History of kidney stones   . HOH (hard of hearing)   . Hypertension     Past Surgical History:  Procedure Laterality Date  . ANKLE SURGERY Right 1961   removal of ankle bone  . CATARACT EXTRACTION W/PHACO  03/11/2012   Procedure: CATARACT EXTRACTION PHACO AND INTRAOCULAR LENS PLACEMENT (IOC);  Surgeon: Tonny Branch, MD;  Location: [facility];  Service: Ophthalmology;  Laterality: Left;  CDE 11.09  . COLONOSCOPY    . COLONOSCOPY N/A 03/01/2013  Procedure: COLONOSCOPY;  Surgeon: Rogene Houston, MD;  Location: [facility];  Service: Endoscopy;  Laterality: N/A;  730  . ESOPHAGOGASTRODUODENOSCOPY N/A 09/10/2017   Procedure: ESOPHAGOGASTRODUODENOSCOPY (EGD);  Surgeon: Rogene Houston, MD;  Location: [facility];  Service: Endoscopy;  Laterality: N/A;  245  . EYE SURGERY    . JOINT REPLACEMENT Bilateral    RIght hip 12-2013 and bilateral [id number]  . spinal cyst  1946  . TOTAL HIP ARTHROPLASTY Right 12/22/2013   Procedure: RIGHT TOTAL HIP ARTHROPLASTY ANTERIOR APPROACH;  Surgeon: Gearlean Alf, MD;  Location: [facility];  Service: Orthopedics;  Laterality: Right;    Social History   Socioeconomic History  . Marital  status: Married    Spouse name: Not on file  . Number of children: Not on file  . Years of education: Not on file  . Highest education level: Not on file  Occupational History  . Not on file  Tobacco Use  . Smoking status: Former Smoker    Years: 2.00    Types: Cigarettes    Start date: 02/22/1943    Quit date: 05/05/1948    Years since quitting: 71.3  . Smokeless tobacco: Former Systems developer    Types: Chew    Quit date: 05/05/2002  . Tobacco comment: used to chew tobacco, age 77   Substance and Sexual Activity  . Alcohol use: No    Alcohol/week: 0.0 standard drinks  . Drug use: No  . Sexual activity: Yes    Birth control/protection: None  Other Topics Concern  . Not on file  Social History Narrative  . Not on file   Social Determinants of Health   Financial Resource Strain:   . Difficulty of Paying Living Expenses:   Food Insecurity:   . Worried About Charity fundraiser in the Last Year:   . Arboriculturist in the Last Year:   Transportation Needs:   . Film/video editor (Medical):   Marland Kitchen Lack of Transportation (Non-Medical):   Physical Activity:   . Days of Exercise per Week:   . Minutes of Exercise per Session:   Stress:   . Feeling of Stress :   Social Connections:   . Frequency of Communication with Friends and Family:   . Frequency of Social Gatherings with Friends and Family:   . Attends Religious Services:   . Active Member of Clubs or Organizations:   . Attends Archivist Meetings:   Marland Kitchen Marital Status:   Intimate Partner Violence:   . Fear of Current or Ex-Partner:   . Emotionally Abused:   Marland Kitchen Physically Abused:   . Sexually Abused:      Vitals:   08/12/19 1138  BP: (!) 142/60  Pulse: 66  SpO2: 96%  Weight: 176 lb (79.8 kg)  Height: 5\' 9"  (1.753 m)    Wt Readings from Last 3 Encounters:  08/12/19 176 lb (79.8 kg)  05/11/19 174 lb 11.2 oz (79.2 kg)  06/17/18 178 lb (80.7 kg)     PHYSICAL EXAM General: NAD HEENT: Normal. Neck: No JVD,  no thyromegaly. Lungs: Clear to auscultation bilaterally with normal respiratory effort. CV: Bradycardic, regular rhythm, normal S1/S2, no S3/S4, no murmur. No pretibial or periankle edema.  No carotid bruit.   Abdomen: Soft, nontender, no distention.  Neurologic: Alert and oriented.  Psych: Normal affect. Skin: Normal. Musculoskeletal: No gross deformities.    ECG: Reviewed above under Subjective   Labs: Lab Results  Component Value Date/Time  K 4.2 12/24/2013 05:00 AM   BUN 11 12/24/2013 05:00 AM   CREATININE 0.61 12/24/2013 05:00 AM   ALT 20 12/14/2013 01:00 PM   HGB 14.2 05/11/2019 10:05 AM     Lipids: No results found for: LDLCALC, LDLDIRECT, CHOL, TRIG, HDL     ASSESSMENT AND PLAN:  1. CAD: Stable ischemic heart disease. Continue ASA 81 mg daily, metoprolol (I will reduce the dose to 12.5 mg twice daily), and atorvastatin. Mildly abnormal stress test in 07/2012 without high risk features and evidence of coronary calcifications on CT scan. Due to his age, poor functional capacity, and no convincing symptoms of angina, I think continued medical therapy is the best option in his situation.   2. Mitral regurgitation: Moderate when last checked and asymptomatic from this standpoint. No murmur appreciated today.  3. Essential HTN:  BP is mildly elevated.  This will need continued monitoring given reduction in beta-blocker dose.  4. Hyperlipidemia: Continue atorvastatin.  5.  Bradycardia and dizziness: Dizziness essentially resolved with reduction of beta-blocker dose at last visit in February 2020.  I am reducing it further to 12.5 mg twice daily.   Disposition: Follow up 6 months   Kate Sable, M.D., F.A.C.C.

## 2019-08-12 NOTE — Patient Instructions (Addendum)
Medication Instructions:   Decrease Lopressor to 12.5mg  twice a day.  Continue all other medications.    Labwork: none  Testing/Procedures: none  Follow-Up: 6 months   Any Other Special Instructions Will Be Listed Below (If Applicable).  If you need a refill on your cardiac medications before your next appointment, please call your pharmacy.

## 2019-09-02 DIAGNOSIS — I10 Essential (primary) hypertension: Secondary | ICD-10-CM | POA: Diagnosis not present

## 2019-09-14 ENCOUNTER — Ambulatory Visit (INDEPENDENT_AMBULATORY_CARE_PROVIDER_SITE_OTHER): Payer: Medicare Other | Admitting: Gastroenterology

## 2019-09-19 DIAGNOSIS — H35373 Puckering of macula, bilateral: Secondary | ICD-10-CM | POA: Diagnosis not present

## 2019-09-19 DIAGNOSIS — H2511 Age-related nuclear cataract, right eye: Secondary | ICD-10-CM | POA: Diagnosis not present

## 2019-09-19 DIAGNOSIS — E119 Type 2 diabetes mellitus without complications: Secondary | ICD-10-CM | POA: Diagnosis not present

## 2019-10-02 DIAGNOSIS — I10 Essential (primary) hypertension: Secondary | ICD-10-CM | POA: Diagnosis not present

## 2019-10-05 ENCOUNTER — Ambulatory Visit (INDEPENDENT_AMBULATORY_CARE_PROVIDER_SITE_OTHER): Payer: Medicare Other | Admitting: Gastroenterology

## 2019-10-05 ENCOUNTER — Other Ambulatory Visit: Payer: Self-pay

## 2019-10-05 ENCOUNTER — Encounter (INDEPENDENT_AMBULATORY_CARE_PROVIDER_SITE_OTHER): Payer: Self-pay | Admitting: Gastroenterology

## 2019-10-05 VITALS — BP 149/72 | HR 61 | Temp 97.2°F | Ht 69.0 in | Wt 169.3 lb

## 2019-10-05 DIAGNOSIS — Z8719 Personal history of other diseases of the digestive system: Secondary | ICD-10-CM

## 2019-10-05 DIAGNOSIS — R195 Other fecal abnormalities: Secondary | ICD-10-CM

## 2019-10-05 NOTE — Patient Instructions (Addendum)
We are checking labs today for evaluation of hemoglobin and to make sure not anemic. We will call w/ results.  Continue limiting use of Advil, naproxen, Excedrin.  If you are taking make sure to take with food.

## 2019-10-05 NOTE — Progress Notes (Addendum)
Patient profile: Wesley Garrison is a 86 y.o. male seen for f/up.  History of Present Illness: Wesley Garrison is seen today for follow-up, he was last seen in January 2021 for evaluation of heme positive stool.  He had a normal hemoglobin and iron studies.  He had no GI alarm symptoms.  Given his age and prior work-up as below for heme positive stool recommended to monitor labs in 3 months.  He is seen today for follow-up and reports he is doing well.  Since his last visit he has had a decrease in his very intermittent diarrhea.  He has found foods such as bread and potatoes seem to trigger the diarrhea.  He is usually having a bowel movement daily without abdominal pain.  He does have occasional fecal leakage when passes gas.   He denies any GERD symptoms as long she takes Protonix daily.  His weight is down a few pounds as below but he feels this is due to decreasing starchy foods such as bread and potatoes as above.  He denies any nausea vomiting, post prandial abd pain, or appetite changes.  He does report some mild dysphagia-unsure exactly how often this occurs, most prominent to liquids and lower esophageal area.  Sometimes these make him feel like "he is going to faint"   Wt Readings from Last 3 Encounters:  10/05/19 169 lb 4.8 oz (76.8 kg)  08/12/19 176 lb (79.8 kg)  05/11/19 174 lb 11.2 oz (79.2 kg)     Last Colonoscopy:  (done for heme positive stool) 2014-Impression:  Examination performed to cecum. Moderate sigmoid colon diverticulosis. Internal/external hemorrhoids. No evidence of polyps masses or AV malformations.   Last Endoscopy: 2019 (done for melena) - Impression: - Normal esophagus. - Z-line irregular, 40 cm from the incisors. - Normal stomach. - Normal duodenal bulb and second portion of the duodenum. - No specimens collected   Past Medical History:  Past Medical History:  Diagnosis Date  . Arthritis   . Diarrhea 08/04/2017  . DM type 2 (diabetes  mellitus, type 2) ([place])    for at least 10 years  . GERD (gastroesophageal reflux disease)   . Gout   . History of kidney stones   . HOH (hard of hearing)   . Hypertension     Problem List: Patient Active Problem List   Diagnosis Date Noted  . Melena 08/13/2017  . Diarrhea 08/04/2017  . OA (osteoarthritis) of hip 12/22/2013  . Heme positive stool 02/21/2013  . Diabetes ([place]) 02/21/2013  . High cholesterol 02/21/2013  . Gout 02/21/2013  . Mitral regurgitation 08/27/2012  . Dyspnea 07/15/2012  . Coronary atherosclerosis of native coronary artery 07/15/2012    Past Surgical History: Past Surgical History:  Procedure Laterality Date  . ANKLE SURGERY Right 1961   removal of ankle bone  . CATARACT EXTRACTION W/PHACO  03/11/2012   Procedure: CATARACT EXTRACTION PHACO AND INTRAOCULAR LENS PLACEMENT (IOC);  Surgeon: Tonny Branch, MD;  Location: [facility];  Service: Ophthalmology;  Laterality: Left;  CDE 11.09  . COLONOSCOPY    . COLONOSCOPY N/A 03/01/2013   Procedure: COLONOSCOPY;  Surgeon: Rogene Houston, MD;  [facility];  Service: Endoscopy;  Laterality: N/A;  730  . ESOPHAGOGASTRODUODENOSCOPY N/A 09/10/2017   Procedure: ESOPHAGOGASTRODUODENOSCOPY (EGD);  Surgeon: Rogene Houston, MD;  [facility]: [facility];  Service: Endoscopy;  Laterality: N/A;  245  . EYE SURGERY    . JOINT REPLACEMENT Bilateral    RIght hip  [id number] and bilateral [id number]  . spinal cyst  1946  . TOTAL HIP ARTHROPLASTY Right 12/22/2013   Procedure: RIGHT TOTAL HIP ARTHROPLASTY ANTERIOR APPROACH;  Surgeon: Gearlean Alf, MD;  Location: [facility];  Service: Orthopedics;  Laterality: Right;    Allergies: No Known Allergies    Home Medications:  Current Outpatient Medications:  .  amLODipine (NORVASC) 5 MG tablet, Take 5 mg by mouth daily., Disp: , Rfl:  .  aspirin EC 81 MG tablet, Take 162 mg by mouth daily. , Disp: , Rfl:  .  atorvastatin (LIPITOR) 20 MG tablet, Take 20 mg by mouth daily. , Disp: ,  Rfl:  .  diphenoxylate-atropine (LOMOTIL) 2.5-0.025 MG tablet, Take 1 tablet by mouth 4 (four) times daily as needed for diarrhea or loose stools. , Disp: , Rfl:  .  fluticasone (FLONASE) 50 MCG/ACT nasal spray, Place 2 sprays into both nostrils daily as needed for allergies. , Disp: , Rfl:  .  GLIPIZIDE XL 5 MG 24 hr tablet, Take 5-10 mg by mouth See admin instructions. Take 10 mg by mouth in the morning and take 5 mg by mouth in the evening, Disp: , Rfl:  .  JANUVIA 100 MG tablet, Take 100 mg by mouth daily. , Disp: , Rfl:  .  lisinopril (PRINIVIL,ZESTRIL) 20 MG tablet, Take 20 mg by mouth 2 (two) times daily., Disp: , Rfl:  .  metoprolol tartrate (LOPRESSOR) 25 MG tablet, Take 0.5 tablets (12.5 mg total) by mouth 2 (two) times daily., Disp: , Rfl:  .  pantoprazole (PROTONIX) 40 MG tablet, Take 40 mg by mouth every morning., Disp: , Rfl:  .  Tamsulosin HCl (FLOMAX) 0.4 MG CAPS, Take 0.4 mg by mouth 2 (two) times daily. , Disp: , Rfl:  .  TURMERIC PO, Take 1 capsule by mouth daily., Disp: , Rfl:  No current facility-administered medications for this visit.  Facility-Administered Medications Ordered in Other Visits:  .  tranexamic acid (CYKLOKAPRON) 2,000 mg in sodium chloride 0.9 % 50 mL Topical Application, 123XX123 mg, Topical, Once, Cecilio Asper, [employer], PA-C   Family History: family history includes Diabetes in his mother.    Social History:   reports that he quit smoking about 71 years ago. His smoking use included cigarettes. He started smoking about 76 years ago. He quit after 2.00 years of use. He quit smokeless tobacco use about 17 years ago.  His smokeless tobacco use included chew. He reports that he does not drink alcohol or use drugs.   Review of Systems: Constitutional: Denies weight loss/weight gain  Eyes: No changes in vision. ENT: No oral lesions, sore throat.  GI: see HPI.  Heme/Lymph: No easy bruising.  CV: No chest pain.  GU: No hematuria.  Integumentary: No rashes.    Neuro: No headaches.  Psych: No depression/anxiety.  Endocrine: No heat/cold intolerance.  Allergic/Immunologic: No urticaria.  Resp: No cough, SOB.  Musculoskeletal: No joint swelling.    Physical Examination: BP (!) 149/72 (BP Location: Right Arm, Patient Position: Sitting, Cuff Size: Large)   Pulse 61   Temp (!) 97.2 F (36.2 C) (Temporal)   Ht 5\' 9"  (1.753 m)   Wt 169 lb 4.8 oz (76.8 kg)   BMI 25.00 kg/m  Gen: NAD, alert and oriented x 4 HEENT: PEERLA, EOMI, Neck: supple, no JVD Chest: CTA bilaterally, no wheezes, crackles, or other adventitious sounds CV: RRR, no m/g/c/r Abd: soft, NT, ND, +BS in all four quadrants; no HSM, guarding, ridigity, or rebound tenderness Ext:  no edema, well perfused with 2+ pulses, Skin: no rash or lesions noted on observed skin Lymph: no noted LAD  Data Reviewed:  Jan 2021--Iron 67, ferritin 137, TIBC 301, Hgb 14.2, MCV 89   Assessment/Plan: Mr. Billups is a 86 y.o. male seen in January for positive Hemoccult without significant GI symptoms and absence of anemia or iron deficiency.  Given age of 78 and lack of alarm symptoms recommended to monitor labs at 3 months, if his hemoglobin is dropped down or developed iron deficiency could plan for endoscopy or colonoscopy, otherwise we will hold on significant work-up for positive Hemoccult.  He is seeing brown stools.  2.  Diarrhea-resolved with avoiding breads and potatoes.  Has as needed Imodium to use which works well  3.  Dysphagia-liquids dysphagia-likely dysmotility given age.  We discussed in detail barium swallow or endoscopy for eval but he would like to hold off on this.  He is to contact me if symptoms worsen.  4. Wt loss - patient feels this is definitely diet related, reviewed to monitor and contact me if continues. Declines further w/up  Dahntay was seen today for follow-up.  Diagnoses and all orders for this visit:  Fecal occult blood test positive -     CBC with  Differential -     Fe+TIBC+Fer       I personally performed the service, non-incident to. (WP)  Laurine Blazer, [facility]

## 2019-10-25 NOTE — H&P (Signed)
Surgical History & Physical  Patient Name: Wesley Garrison DOB: 09/04/1924  Surgery: Cataract extraction with intraocular lens implant phacoemulsification; Right Eye  Surgeon: Baruch Goldmann MD Surgery Date:  11/02/2019 Pre-Op Date:  10/25/2019  HPI: A 27 Yr. old male patient presents for a cataract evaluation. PT has is s/p YAG OS. He is here today for evaluation of right eye. Pt is currently wearing glasses. He reports blurred vision in the right eye, noted as a gradual change. PT reports all ranges are blurred in the right eye and feels his glasses are no longer helping. This is negatively affecting the patient's quality of life. No eye pain. Pt has a significant ocular hx in his left eye. No eye pain. No flashes of light but does have floaters. DM II- BS yesterday was 139. Last A1C: 6.2 HPI was performed by Baruch Goldmann .  Medical History: Dry Eyes Cataracts OS: Macular ERM 2014, OD: Macula Cyst 2014, OS: Amblyopia Stabismic, OS: Asteroid Hyalosis, OS: Vitreous Syneresis Diabetes - DM Type 2 High Blood Pressure LDL  Review of Systems Cardiovascular High Blood Pressure All recorded systems are negative except as noted above.  Social   Former smoker of Cigarettes   Medication Aspirin Low dose, Amiodipine, Celebrex, Gilipizide, Lisinopril, Metformin, Metoprolol, Tamsulosin,   Sx/Procedures Cataract Surgery, YAG Capsulotomy,   Drug Allergies   NKDA  History & Physical: Heent:  Cataract, Right eye NECK: supple without bruits LUNGS: lungs clear to auscultation CV: regular rate and rhythm Abdomen: soft and non-tender  Impression & Plan: Assessment: 1.  NUCLEAR SCLEROSIS AGE RELATED; Right Eye (H25.11) 2.  Diabetes Type 2 No retinopathy (E11.9) 3.  MACULAR PUCKER; Both Eyes (H35.373)  Plan: 1.  Cataract accounts for the patient's decreased vision. This visual impairment is not correctable with a tolerable change in glasses or contact lenses. Cataract surgery with an  implantation of a new lens should significantly improve the visual and functional status of the patient. Discussed all risks, benefits, alternatives, and potential complications. Discussed the procedures and recovery. Patient desires to have surgery. A-scan ordered and performed today for intra-ocular lens calculations. The surgery will be performed in order to improve vision for driving, reading, and for eye examinations. Recommend phacoemulsification with intra-ocular lens. Right Eye. Surgery required to correct imbalance of vision. Use Lens Calcs from 2013 - will aim from those calcs to maintain balance between the two eyes (need to remain about a +2.00 to avoid anisometropia). poor dilation - Flomax. Malyugin ring. Omidria. 2.  Stressed importance of blood sugar and blood pressure control, and also yearly eye examinations. 3.  S/P Vitrectomy OS. Borderline visually significant. Continue to monitor.

## 2019-10-25 NOTE — Patient Instructions (Signed)
COMPTON BRIGANCE  10/25/2019     @PREFPERIOPPHARMACY @   Your procedure is scheduled on  11/02/2019 .  Report to [facility] at  [place].M.  Call this number if you have problems the morning of surgery:  [phone]   Remember:  Do not eat or drink after midnight.                        Take these medicines the morning of surgery with A SIP OF WATER  Amlodipine, metoprolol, protonix, flomax.    Do not wear jewelry, make-up or nail polish.  Do not wear lotions, powders, or perfumes. Please wear deodorant and brush your teeth.  Do not shave 48 hours prior to surgery.  Men may shave face and neck.  Do not bring valuables to the hospital.  [facility] is not responsible for any belongings or valuables.  Contacts, dentures or bridgework may not be worn into surgery.  Leave your suitcase in the car.  After surgery it may be brought to your room.  For patients admitted to the hospital, discharge time will be determined by your treatment team.  Patients discharged the day of surgery will not be allowed to drive home.   Name and phone number of your driver:   family Special instructions:  DO NOT smoke the day of your procedure.  Please read over the following fact sheets that you were given. Anesthesia Post-op Instructions and Care and Recovery After Surgery       Cataract Surgery, Care After This sheet gives you information about how to care for yourself after your procedure. Your health care provider may also give you more specific instructions. If you have problems or questions, contact your health care provider. What can I expect after the procedure? After the procedure, it is common to have:  Itching.  Discomfort.  Fluid discharge.  Sensitivity to light and to touch.  Bruising in or around the eye.  Mild blurred vision. Follow these instructions at home: Eye care   Do not touch or rub your eyes.  Protect your eyes as told by your health care provider.  You may be told to wear a protective eye shield or sunglasses.  Do not put a contact lens into the affected eye or eyes until your health care provider approves.  Keep the area around your eye clean and dry: ? Avoid swimming. ? Do not allow water to hit you directly in the face while showering. ? Keep soap and shampoo out of your eyes.  Check your eye every day for signs of infection. Watch for: ? Redness, swelling, or pain. ? Fluid, blood, or pus. ? Warmth. ? A bad smell. ? Vision that is getting worse. ? Sensitivity that is getting worse. Activity  Do not drive for 24 hours if you were given a sedative during your procedure.  Avoid strenuous activities, such as playing contact sports, for as long as told by your health care provider.  Do not drive or use heavy machinery until your health care provider approves.  Do not bend or lift heavy objects. Bending increases pressure in the eye. You can walk, climb stairs, and do light household chores.  Ask your health care provider when you can return to work. If you work in a dusty environment, you may be advised to wear protective eyewear for a period of time. General instructions  Take or apply over-the-counter and prescription  medicines only as told by your health care provider. This includes eye drops.  Keep all follow-up visits as told by your health care provider. This is important. Contact a health care provider if:  You have increased bruising around your eye.  You have pain that is not helped with medicine.  You have a fever.  You have redness, swelling, or pain in your eye.  You have fluid, blood, or pus coming from your incision.  Your vision gets worse.  Your sensitivity to light gets worse. Get help right away if:  You have sudden loss of vision.  You see flashes of light or spots (floaters).  You have severe eye pain.  You develop nausea or vomiting. Summary  After your procedure, it is common to have  itching, discomfort, bruising, fluid discharge, or sensitivity to light.  Follow instructions from your health care provider about caring for your eye after the procedure.  Do not rub your eye after the procedure. You may need to wear eye protection or sunglasses. Do not wear contact lenses. Keep the area around your eye clean and dry.  Avoid activities that require a lot of effort. These include playing sports and lifting heavy objects.  Contact a health care provider if you have increased bruising, pain that does not go away, or a fever. Get help right away if you suddenly lose your vision, see flashes of light or spots, or have severe pain in the eye. This information is not intended to replace advice given to you by your health care provider. Make sure you discuss any questions you have with your health care provider. Document Revised: 02/15/2019 Document Reviewed: 10/19/2017 Elsevier Patient Education  2020 [place] After These instructions provide you with information about caring for yourself after your procedure. Your health care provider may also give you more specific instructions. Your treatment has been planned according to current medical practices, but problems sometimes occur. Call your health care provider if you have any problems or questions after your procedure. What can I expect after the procedure? After your procedure, you may:  Feel sleepy for several hours.  Feel clumsy and have poor balance for several hours.  Feel forgetful about what happened after the procedure.  Have poor judgment for several hours.  Feel nauseous or vomit.  Have a sore throat if you had a breathing tube during the procedure. Follow these instructions at home: For at least 24 hours after the procedure:      Have a responsible adult stay with you. It is important to have someone help care for you until you are awake and alert.  Rest as needed.  Do  not: ? Participate in activities in which you could fall or become injured. ? Drive. ? Use heavy machinery. ? Drink alcohol. ? Take sleeping pills or medicines that cause drowsiness. ? Make important decisions or sign legal documents. ? Take care of children on your own. Eating and drinking  Follow the diet that is recommended by your health care provider.  If you vomit, drink water, juice, or soup when you can drink without vomiting.  Make sure you have little or no nausea before eating solid foods. General instructions  Take over-the-counter and prescription medicines only as told by your health care provider.  If you have sleep apnea, surgery and certain medicines can increase your risk for breathing problems. Follow instructions from your health care provider about wearing your sleep device: ? Anytime you  are sleeping, including during daytime naps. ? While taking prescription pain medicines, sleeping medicines, or medicines that make you drowsy.  If you smoke, do not smoke without supervision.  Keep all follow-up visits as told by your health care provider. This is important. Contact a health care provider if:  You keep feeling nauseous or you keep vomiting.  You feel light-headed.  You develop a rash.  You have a fever. Get help right away if:  You have trouble breathing. Summary  For several hours after your procedure, you may feel sleepy and have poor judgment.  Have a responsible adult stay with you for at least 24 hours or until you are awake and alert. This information is not intended to replace advice given to you by your health care provider. Make sure you discuss any questions you have with your health care provider. Document Revised: 07/20/2017 Document Reviewed: 08/12/2015 Elsevier Patient Education  [place].

## 2019-10-31 ENCOUNTER — Other Ambulatory Visit: Payer: Self-pay

## 2019-10-31 ENCOUNTER — Other Ambulatory Visit (HOSPITAL_COMMUNITY)
Admission: RE | Admit: 2019-10-31 | Discharge: 2019-10-31 | Disposition: A | Discharge status: Home / Self Care | Payer: Medicare Other | Source: Ambulatory Visit | Attending: Ophthalmology | Admitting: Ophthalmology

## 2019-10-31 ENCOUNTER — Encounter (HOSPITAL_COMMUNITY)
Admission: RE | Admit: 2019-10-31 | Discharge: 2019-10-31 | Disposition: A | Discharge status: Home / Self Care | Payer: Medicare Other | Source: Ambulatory Visit | Attending: Ophthalmology | Admitting: Ophthalmology

## 2019-10-31 ENCOUNTER — Encounter (HOSPITAL_COMMUNITY): Payer: Self-pay

## 2019-10-31 DIAGNOSIS — Z20822 Contact with and (suspected) exposure to covid-19: Secondary | ICD-10-CM | POA: Diagnosis not present

## 2019-10-31 DIAGNOSIS — Z01812 Encounter for preprocedural laboratory examination: Secondary | ICD-10-CM | POA: Diagnosis not present

## 2019-10-31 LAB — CBC WITH DIFFERENTIAL/PLATELET
Abs Immature Granulocytes: 0.04 10*3/uL (ref 0.00–0.07)
Basophils Absolute: 0 10*3/uL (ref 0.0–0.1)
Basophils Relative: 0 %
Eosinophils Absolute: 0.1 10*3/uL (ref 0.0–0.5)
Eosinophils Relative: 1 %
HCT: 40.9 % (ref 39.0–52.0)
Hemoglobin: 13.5 g/dL (ref 13.0–17.0)
Immature Granulocytes: 1 %
Lymphocytes Relative: 36 %
Lymphs Abs: 2.8 10*3/uL (ref 0.7–4.0)
MCH: 29.6 pg (ref 26.0–34.0)
MCHC: 33 g/dL (ref 30.0–36.0)
MCV: 89.7 fL (ref 80.0–100.0)
Monocytes Absolute: 0.7 10*3/uL (ref 0.1–1.0)
Monocytes Relative: 9 %
Neutro Abs: 4.1 10*3/uL (ref 1.7–7.7)
Neutrophils Relative %: 53 %
Platelets: 171 10*3/uL (ref 150–400)
RBC: 4.56 MIL/uL (ref 4.22–5.81)
RDW: 12.8 % (ref 11.5–15.5)
WBC: 7.8 10*3/uL (ref 4.0–10.5)
nRBC: 0 % (ref 0.0–0.2)

## 2019-10-31 LAB — BASIC METABOLIC PANEL
Anion gap: 9 (ref 5–15)
BUN: 13 mg/dL (ref 8–23)
CO2: 25 mmol/L (ref 22–32)
Calcium: 8.6 mg/dL — ABNORMAL LOW (ref 8.9–10.3)
Chloride: 105 mmol/L (ref 98–111)
Creatinine, Ser: 0.57 mg/dL — ABNORMAL LOW (ref 0.61–1.24)
GFR calc Af Amer: >60 mL/min (ref >60)
GFR calc non Af Amer: >60 mL/min (ref >60)
Glucose, Bld: 152 mg/dL — ABNORMAL HIGH (ref 70–99)
Potassium: 3.9 mmol/L (ref 3.5–5.1)
Sodium: 139 mmol/L (ref 135–145)

## 2019-10-31 LAB — HEMOGLOBIN A1C
Hgb A1c MFr Bld: 6.8 % — ABNORMAL HIGH (ref 4.8–5.6)
Mean Plasma Glucose: 148.46 mg/dL

## 2019-11-02 ENCOUNTER — Ambulatory Visit (HOSPITAL_COMMUNITY)
Admission: RE | Admit: 2019-11-02 | Discharge: 2019-11-02 | Disposition: A | Discharge status: Home / Self Care | Payer: Medicare Other | Attending: Ophthalmology | Admitting: Ophthalmology

## 2019-11-02 ENCOUNTER — Ambulatory Visit (HOSPITAL_COMMUNITY): Payer: Medicare Other | Admitting: Anesthesiology

## 2019-11-02 ENCOUNTER — Encounter (HOSPITAL_COMMUNITY): Payer: Self-pay | Admitting: Ophthalmology

## 2019-11-02 ENCOUNTER — Encounter (HOSPITAL_COMMUNITY)
Admission: RE | Disposition: A | Discharge status: Home / Self Care | Payer: Self-pay | Source: Home / Self Care | Attending: Ophthalmology

## 2019-11-02 DIAGNOSIS — Z791 Long term (current) use of non-steroidal anti-inflammatories (NSAID): Secondary | ICD-10-CM | POA: Diagnosis not present

## 2019-11-02 DIAGNOSIS — I2510 Atherosclerotic heart disease of native coronary artery without angina pectoris: Secondary | ICD-10-CM | POA: Insufficient documentation

## 2019-11-02 DIAGNOSIS — H35373 Puckering of macula, bilateral: Secondary | ICD-10-CM | POA: Diagnosis not present

## 2019-11-02 DIAGNOSIS — H2181 Floppy iris syndrome: Secondary | ICD-10-CM | POA: Diagnosis not present

## 2019-11-02 DIAGNOSIS — Z7984 Long term (current) use of oral hypoglycemic drugs: Secondary | ICD-10-CM | POA: Diagnosis not present

## 2019-11-02 DIAGNOSIS — Z87891 Personal history of nicotine dependence: Secondary | ICD-10-CM | POA: Diagnosis not present

## 2019-11-02 DIAGNOSIS — Z79899 Other long term (current) drug therapy: Secondary | ICD-10-CM | POA: Insufficient documentation

## 2019-11-02 DIAGNOSIS — H25811 Combined forms of age-related cataract, right eye: Secondary | ICD-10-CM | POA: Insufficient documentation

## 2019-11-02 DIAGNOSIS — I10 Essential (primary) hypertension: Secondary | ICD-10-CM | POA: Diagnosis not present

## 2019-11-02 DIAGNOSIS — E1136 Type 2 diabetes mellitus with diabetic cataract: Secondary | ICD-10-CM | POA: Insufficient documentation

## 2019-11-02 DIAGNOSIS — Z7982 Long term (current) use of aspirin: Secondary | ICD-10-CM | POA: Diagnosis not present

## 2019-11-02 HISTORY — PX: CATARACT EXTRACTION W/PHACO: SHX586

## 2019-11-02 LAB — GLUCOSE, CAPILLARY: Glucose-Capillary: 155 mg/dL — ABNORMAL HIGH (ref 70–99)

## 2019-11-02 SURGERY — PHACOEMULSIFICATION, CATARACT, WITH IOL INSERTION
Anesthesia: Monitor Anesthesia Care | Site: Eye | Laterality: Right

## 2019-11-02 MED ORDER — LACTATED RINGERS IV SOLN: INTRAVENOUS | Status: DC

## 2019-11-02 MED ORDER — LIDOCAINE HCL 3.5 % OP GEL
1.0000 "application " | Freq: Once | OPHTHALMIC | Status: AC
  Administered 2019-11-02: 1 via OPHTHALMIC

## 2019-11-02 MED ORDER — POVIDONE-IODINE 5 % OP SOLN
OPHTHALMIC | Status: DC | PRN
  Administered 2019-11-02: 1 via OPHTHALMIC

## 2019-11-02 MED ORDER — PROVISC 10 MG/ML IO SOLN
INTRAOCULAR | Status: DC | PRN
  Administered 2019-11-02: 0.85 mL via INTRAOCULAR

## 2019-11-02 MED ORDER — DEXAMETHASONE 0.4 MG OP INST
VAGINAL_INSERT | OPHTHALMIC | Status: AC
  Filled 2019-11-02: qty 1

## 2019-11-02 MED ORDER — PHENYLEPHRINE HCL 2.5 % OP SOLN
1.0000 [drp] | OPHTHALMIC | Status: AC | PRN
  Administered 2019-11-02 (×3): 1 [drp] via OPHTHALMIC

## 2019-11-02 MED ORDER — PHENYLEPHRINE-KETOROLAC 1-0.3 % IO SOLN
INTRAOCULAR | Status: DC | PRN
  Administered 2019-11-02: 500 mL via OPHTHALMIC

## 2019-11-02 MED ORDER — PHENYLEPHRINE-KETOROLAC 1-0.3 % IO SOLN
INTRAOCULAR | Status: AC
  Filled 2019-11-02: qty 4

## 2019-11-02 MED ORDER — BSS IO SOLN
INTRAOCULAR | Status: DC | PRN
  Administered 2019-11-02: 15 mL via INTRAOCULAR

## 2019-11-02 MED ORDER — TETRACAINE HCL 0.5 % OP SOLN
1.0000 [drp] | OPHTHALMIC | Status: AC | PRN
  Administered 2019-11-02 (×3): 1 [drp] via OPHTHALMIC

## 2019-11-02 MED ORDER — SODIUM HYALURONATE 23 MG/ML IO SOLN
INTRAOCULAR | Status: DC | PRN
  Administered 2019-11-02: 0.6 mL via INTRAOCULAR

## 2019-11-02 MED ORDER — CYCLOPENTOLATE-PHENYLEPHRINE 0.2-1 % OP SOLN
1.0000 [drp] | OPHTHALMIC | Status: AC | PRN
  Administered 2019-11-02 (×3): 1 [drp] via OPHTHALMIC

## 2019-11-02 MED ORDER — LIDOCAINE HCL (PF) 1 % IJ SOLN
INTRAOCULAR | Status: DC | PRN
  Administered 2019-11-02: 1 mL via OPHTHALMIC

## 2019-11-02 MED ORDER — DEXAMETHASONE 0.4 MG OP INST
VAGINAL_INSERT | OPHTHALMIC | Status: DC | PRN
  Administered 2019-11-02: 0.4 mg via OPHTHALMIC

## 2019-11-02 MED ORDER — SODIUM CHLORIDE 0.9% FLUSH: 10.0000 mL | INTRAVENOUS | Status: DC | PRN

## 2019-11-02 SURGICAL SUPPLY — 13 items
CLOTH BEACON ORANGE TIMEOUT ST (SAFETY) ×3 IMPLANT
EYE SHIELD UNIVERSAL CLEAR (GAUZE/BANDAGES/DRESSINGS) ×3 IMPLANT
GLOVE BIOGEL PI IND STRL 7.0 (GLOVE) ×2 IMPLANT
GLOVE BIOGEL PI INDICATOR 7.0 (GLOVE) ×4
LENS ALC ACRYL/TECN (Ophthalmic Related) ×3 IMPLANT
NEEDLE HYPO 18GX1.5 BLUNT FILL (NEEDLE) ×3 IMPLANT
PAD ARMBOARD 7.5X6 YLW CONV (MISCELLANEOUS) ×3 IMPLANT
RING MALYGIN 7.0 (MISCELLANEOUS) ×3 IMPLANT
SYR TB 1ML LL NO SAFETY (SYRINGE) ×3 IMPLANT
TAPE SURG TRANSPORE 1 IN (GAUZE/BANDAGES/DRESSINGS) ×1 IMPLANT
TAPE SURGICAL TRANSPORE 1 IN (GAUZE/BANDAGES/DRESSINGS) ×2
VISCOELASTIC ADDITIONAL (OPHTHALMIC RELATED) ×3 IMPLANT
WATER STERILE IRR 250ML POUR (IV SOLUTION) ×3 IMPLANT

## 2019-11-02 NOTE — Op Note (Signed)
Date of procedure: 11/02/19  Pre-operative diagnosis: Visually significant combined-form age-related cataract, Right Eye; Poor Dilation, Right Eye (H25.811; H21.81)  Post-operative diagnosis: Visually significant cataract, Right Eye; Intra-operative Floppy Iris Syndrome, Right Eye  Procedure:  1. Removal of cataract via phacoemulsification and insertion of intra-ocular lens Johnson and Camak  +18.5D into the capsular bag of the Right Eye (CPT [phone]) 2. Placement of Dextenza insert into the lower canaliculus, right eye  Attending surgeon: Gerda Diss. Alazia Crocket, MD, MA  Anesthesia: MAC, Topical Akten  Complications: None  Estimated Blood Loss: <14m (minimal)  Specimens: None  Implants: As above  Indications:  Visually significant cataract, Right Eye  Procedure:  The patient was seen and identified in the pre-operative area. The operative eye was identified and dilated.  The operative eye was marked.  Topical anesthesia was administered to the operative eye.     The patient was then to the operative suite and placed in the supine position.  A timeout was performed confirming the patient, procedure to be performed, and all other relevant information.   The patient's face was prepped and draped in the usual fashion for intra-ocular surgery.  A lid speculum was placed into the operative eye and the surgical microscope moved into place and focused.  Poor dilation of the iris was confirmed.  A superotemporal paracentesis was created using a 20 gauge paracentesis blade.  Shugarcaine was injected into the anterior chamber.  Viscoelastic was injected into the anterior chamber.  A temporal clear-corneal main wound incision was created using a 2.427mmicrokeratome.  A Malyugin ring was placed.  A continuous curvilinear capsulorrhexis was initiated using an irrigating cystitome and completed using capsulorrhexis forceps.  Hydrodissection and hydrodeliniation were performed.  Viscoelastic was  injected into the anterior chamber.  A phacoemulsification handpiece and a chopper as a second instrument were used to remove the nucleus and epinucleus. The irrigation/aspiration handpiece was used to remove any remaining cortical material.   The capsular bag was reinflated with viscoelastic, checked, and found to be intact.  The intraocular lens was inserted into the capsular bag and dialed into place using a Kuglen hook.  The Malyugin ring was removed.  The irrigation/aspiration handpiece was used to remove any remaining viscoelastic.  The clear corneal wound and paracentesis wounds were then hydrated and checked with Weck-Cels to be watertight.  The lid-speculum was removed. A Dextenza implant was placed in the lower canaliculus. The drape was removed, and the patient's face was cleaned with a wet and dry 4x4.  A clear shield was taped over the eye. The patient was taken to the post-operative care unit in good condition, having tolerated the procedure well.  Post-Op Instructions: [facility] and will receive all other orders and instructions.

## 2019-11-02 NOTE — Interval H&P Note (Signed)
History and Physical Interval Note:  11/02/2019 9:23 AM  Wesley Garrison  has presented today for surgery, with the diagnosis of Nuclear sclerotic cataract - Right eye.  The various methods of treatment have been discussed with the patient and family. After consideration of risks, benefits and other options for treatment, the patient has consented to  Procedure(s) with comments: CATARACT EXTRACTION PHACO AND INTRAOCULAR LENS PLACEMENT (IOC) (Right) - CDE: as a surgical intervention.  The patient's history has been reviewed, patient examined, no change in status, stable for surgery.  I have reviewed the patient's chart and labs.  Questions were answered to the patient's satisfaction.     Baruch Goldmann

## 2019-11-02 NOTE — Transfer of Care (Signed)
Immediate Anesthesia Transfer of Care Note  Patient: Wesley Garrison  Procedure(s) Performed: CATARACT EXTRACTION PHACO AND INTRAOCULAR LENS PLACEMENT RIGHT EYE (Right Eye)  Patient Location: Short Stay  Anesthesia Type:MAC  Level of Consciousness: awake, alert  and patient cooperative  Airway & Oxygen Therapy: Patient Spontanous Breathing  Post-op Assessment: Report given to RN and Post -op Vital signs reviewed and stable  Post vital signs: Reviewed and stable  Last Vitals:  Vitals Value Taken Time  BP    Temp    Pulse    Resp    SpO2      Last Pain:  Vitals:   11/02/19 0819  TempSrc: Oral  PainSc: 0-No pain         Complications: No complications documented.

## 2019-11-02 NOTE — Anesthesia Preprocedure Evaluation (Signed)
Anesthesia Evaluation  Patient identified by MRN, date of birth, ID band Patient awake    Reviewed: Allergy & Precautions, H&P , NPO status , Patient's Chart, lab work & pertinent test results, reviewed documented beta blocker date and time   Airway Mallampati: II  TM Distance: >3 FB Neck ROM: full    Dental no notable dental hx.    Pulmonary shortness of breath, former smoker,    Pulmonary exam normal breath sounds clear to auscultation       Cardiovascular Exercise Tolerance: Good hypertension, + CAD   Rhythm:regular Rate:Normal     Neuro/Psych negative neurological ROS  negative psych ROS   GI/Hepatic Neg liver ROS, GERD  Medicated,  Endo/Other  negative endocrine ROSdiabetes  Renal/GU negative Renal ROS  negative genitourinary   Musculoskeletal   Abdominal   Peds  Hematology negative hematology ROS (+)   Anesthesia Other Findings   Reproductive/Obstetrics negative OB ROS                             Anesthesia Physical Anesthesia Plan  ASA: III  Anesthesia Plan: MAC   Post-op Pain Management:    Induction:   PONV Risk Score and Plan:   Airway Management Planned:   Additional Equipment:   Intra-op Plan:   Post-operative Plan:   Informed Consent: I have reviewed the patients History and Physical, chart, labs and discussed the procedure including the risks, benefits and alternatives for the proposed anesthesia with the patient or authorized representative who has indicated his/her understanding and acceptance.     Dental Advisory Given  Plan Discussed with: CRNA  Anesthesia Plan Comments:         Anesthesia Quick Evaluation

## 2019-11-02 NOTE — Anesthesia Procedure Notes (Signed)
Procedure Name: [place] Performed by: Vista Deck, CRNA Pre-anesthesia Checklist: Patient identified, Emergency Drugs available, Suction available, Timeout performed and Patient being monitored Patient Re-evaluated:Patient Re-evaluated prior to induction Oxygen Delivery Method: Nasal Cannula

## 2019-11-02 NOTE — Anesthesia Postprocedure Evaluation (Signed)
Anesthesia Post Note  Patient: Wesley Garrison  Procedure(s) Performed: CATARACT EXTRACTION PHACO AND INTRAOCULAR LENS PLACEMENT RIGHT EYE (Right Eye)  Patient location during evaluation: Short Stay Anesthesia Type: MAC Level of consciousness: awake and alert and patient cooperative Pain management: satisfactory to patient Vital Signs Assessment: post-procedure vital signs reviewed and stable Respiratory status: spontaneous breathing Cardiovascular status: stable Postop Assessment: no apparent nausea or vomiting Anesthetic complications: no   No complications documented.   Last Vitals:  Vitals:   11/02/19 0819 11/02/19 1005  BP: (!) 142/56 (!) 148/67  Pulse: (!) 51 (!) 53  Resp: 15 17  Temp: 36.6 C 36.5 C  SpO2: 98% 96%    Last Pain:  Vitals:   11/02/19 1005  TempSrc: Oral  PainSc: 0-No pain                 Yameli Delamater

## 2019-11-02 NOTE — Discharge Instructions (Signed)
Please discharge patient when stable, will follow up today with Dr. Milan Clare at the [facility] office immediately following discharge.  Leave shield in place until visit.  All paperwork with discharge instructions will be given at the office.  [facility] Address:  [address]  [place], [place] [address]  

## 2019-11-03 ENCOUNTER — Encounter (HOSPITAL_COMMUNITY): Payer: Self-pay | Admitting: Ophthalmology

## 2019-11-18 DIAGNOSIS — E7800 Pure hypercholesterolemia, unspecified: Secondary | ICD-10-CM | POA: Diagnosis not present

## 2019-11-18 DIAGNOSIS — E1165 Type 2 diabetes mellitus with hyperglycemia: Secondary | ICD-10-CM | POA: Diagnosis not present

## 2019-11-18 DIAGNOSIS — D509 Iron deficiency anemia, unspecified: Secondary | ICD-10-CM | POA: Diagnosis not present

## 2019-11-18 DIAGNOSIS — I10 Essential (primary) hypertension: Secondary | ICD-10-CM | POA: Diagnosis not present

## 2019-11-18 DIAGNOSIS — Z299 Encounter for prophylactic measures, unspecified: Secondary | ICD-10-CM | POA: Diagnosis not present

## 2019-11-18 DIAGNOSIS — I25118 Atherosclerotic heart disease of native coronary artery with other forms of angina pectoris: Secondary | ICD-10-CM | POA: Diagnosis not present

## 2019-11-21 ENCOUNTER — Telehealth (INDEPENDENT_AMBULATORY_CARE_PROVIDER_SITE_OTHER): Payer: Self-pay | Admitting: Gastroenterology

## 2019-11-21 NOTE — Telephone Encounter (Signed)
Labs reviewed to be scanned into chart from primary care showed a hemoglobin of 14.2, MCV 91, iron 90, ferritin 207-will not evaluate further with colonoscopy unless patient prefers or labs worsen given his advanced age.Marland Kitchen

## 2019-12-02 DIAGNOSIS — I10 Essential (primary) hypertension: Secondary | ICD-10-CM | POA: Diagnosis not present

## 2020-01-03 DIAGNOSIS — I10 Essential (primary) hypertension: Secondary | ICD-10-CM | POA: Diagnosis not present

## 2020-01-24 DIAGNOSIS — I10 Essential (primary) hypertension: Secondary | ICD-10-CM | POA: Diagnosis not present

## 2020-01-24 DIAGNOSIS — I25118 Atherosclerotic heart disease of native coronary artery with other forms of angina pectoris: Secondary | ICD-10-CM | POA: Diagnosis not present

## 2020-01-24 DIAGNOSIS — M545 Low back pain: Secondary | ICD-10-CM | POA: Diagnosis not present

## 2020-01-24 DIAGNOSIS — Z299 Encounter for prophylactic measures, unspecified: Secondary | ICD-10-CM | POA: Diagnosis not present

## 2020-01-24 DIAGNOSIS — E1165 Type 2 diabetes mellitus with hyperglycemia: Secondary | ICD-10-CM | POA: Diagnosis not present

## 2020-02-02 DIAGNOSIS — I10 Essential (primary) hypertension: Secondary | ICD-10-CM | POA: Diagnosis not present

## 2020-02-15 DIAGNOSIS — Z23 Encounter for immunization: Secondary | ICD-10-CM | POA: Diagnosis not present

## 2020-02-20 ENCOUNTER — Ambulatory Visit: Payer: Medicare Other | Admitting: Cardiovascular Disease

## 2020-03-02 DIAGNOSIS — I10 Essential (primary) hypertension: Secondary | ICD-10-CM | POA: Diagnosis not present

## 2020-03-02 DIAGNOSIS — E1165 Type 2 diabetes mellitus with hyperglycemia: Secondary | ICD-10-CM | POA: Diagnosis not present

## 2020-03-02 DIAGNOSIS — Z6826 Body mass index (BMI) 26.0-26.9, adult: Secondary | ICD-10-CM | POA: Diagnosis not present

## 2020-03-02 DIAGNOSIS — Z125 Encounter for screening for malignant neoplasm of prostate: Secondary | ICD-10-CM | POA: Diagnosis not present

## 2020-03-02 DIAGNOSIS — Z7189 Other specified counseling: Secondary | ICD-10-CM | POA: Diagnosis not present

## 2020-03-02 DIAGNOSIS — Z79899 Other long term (current) drug therapy: Secondary | ICD-10-CM | POA: Diagnosis not present

## 2020-03-02 DIAGNOSIS — Z0000 Encounter for general adult medical examination without abnormal findings: Secondary | ICD-10-CM | POA: Diagnosis not present

## 2020-03-02 DIAGNOSIS — E7800 Pure hypercholesterolemia, unspecified: Secondary | ICD-10-CM | POA: Diagnosis not present

## 2020-03-02 DIAGNOSIS — R5383 Other fatigue: Secondary | ICD-10-CM | POA: Diagnosis not present

## 2020-03-02 DIAGNOSIS — Z1331 Encounter for screening for depression: Secondary | ICD-10-CM | POA: Diagnosis not present

## 2020-03-02 DIAGNOSIS — Z299 Encounter for prophylactic measures, unspecified: Secondary | ICD-10-CM | POA: Diagnosis not present

## 2020-03-02 DIAGNOSIS — Z1339 Encounter for screening examination for other mental health and behavioral disorders: Secondary | ICD-10-CM | POA: Diagnosis not present

## 2020-03-26 DIAGNOSIS — Z23 Encounter for immunization: Secondary | ICD-10-CM | POA: Diagnosis not present

## 2020-05-14 ENCOUNTER — Ambulatory Visit: Payer: Medicare Other | Admitting: Cardiology

## 2020-06-13 DIAGNOSIS — E1165 Type 2 diabetes mellitus with hyperglycemia: Secondary | ICD-10-CM | POA: Diagnosis not present

## 2020-06-13 DIAGNOSIS — I10 Essential (primary) hypertension: Secondary | ICD-10-CM | POA: Diagnosis not present

## 2020-06-13 DIAGNOSIS — M1710 Unilateral primary osteoarthritis, unspecified knee: Secondary | ICD-10-CM | POA: Diagnosis not present

## 2020-06-13 DIAGNOSIS — I25118 Atherosclerotic heart disease of native coronary artery with other forms of angina pectoris: Secondary | ICD-10-CM | POA: Diagnosis not present

## 2020-06-13 DIAGNOSIS — Z299 Encounter for prophylactic measures, unspecified: Secondary | ICD-10-CM | POA: Diagnosis not present

## 2020-07-17 ENCOUNTER — Ambulatory Visit: Payer: Medicare Other | Admitting: Cardiology

## 2020-09-18 DIAGNOSIS — I25118 Atherosclerotic heart disease of native coronary artery with other forms of angina pectoris: Secondary | ICD-10-CM | POA: Diagnosis not present

## 2020-09-18 DIAGNOSIS — Z299 Encounter for prophylactic measures, unspecified: Secondary | ICD-10-CM | POA: Diagnosis not present

## 2020-09-18 DIAGNOSIS — M109 Gout, unspecified: Secondary | ICD-10-CM | POA: Diagnosis not present

## 2020-09-18 DIAGNOSIS — E1165 Type 2 diabetes mellitus with hyperglycemia: Secondary | ICD-10-CM | POA: Diagnosis not present

## 2020-09-18 DIAGNOSIS — I10 Essential (primary) hypertension: Secondary | ICD-10-CM | POA: Diagnosis not present

## 2020-10-22 DIAGNOSIS — I10 Essential (primary) hypertension: Secondary | ICD-10-CM | POA: Diagnosis not present

## 2020-10-23 DIAGNOSIS — E119 Type 2 diabetes mellitus without complications: Secondary | ICD-10-CM | POA: Diagnosis not present

## 2020-10-23 DIAGNOSIS — Z794 Long term (current) use of insulin: Secondary | ICD-10-CM | POA: Diagnosis not present

## 2020-11-26 DIAGNOSIS — I10 Essential (primary) hypertension: Secondary | ICD-10-CM | POA: Diagnosis not present

## 2020-11-26 DIAGNOSIS — M549 Dorsalgia, unspecified: Secondary | ICD-10-CM | POA: Diagnosis not present

## 2020-11-26 DIAGNOSIS — Z299 Encounter for prophylactic measures, unspecified: Secondary | ICD-10-CM | POA: Diagnosis not present

## 2020-11-26 DIAGNOSIS — G8929 Other chronic pain: Secondary | ICD-10-CM | POA: Diagnosis not present

## 2020-11-26 DIAGNOSIS — Z79899 Other long term (current) drug therapy: Secondary | ICD-10-CM | POA: Diagnosis not present

## 2020-12-04 DIAGNOSIS — M461 Sacroiliitis, not elsewhere classified: Secondary | ICD-10-CM | POA: Diagnosis not present

## 2020-12-04 DIAGNOSIS — Z299 Encounter for prophylactic measures, unspecified: Secondary | ICD-10-CM | POA: Diagnosis not present

## 2020-12-04 DIAGNOSIS — K5900 Constipation, unspecified: Secondary | ICD-10-CM | POA: Diagnosis not present

## 2021-01-02 DIAGNOSIS — E119 Type 2 diabetes mellitus without complications: Secondary | ICD-10-CM | POA: Diagnosis not present

## 2021-01-02 DIAGNOSIS — Z794 Long term (current) use of insulin: Secondary | ICD-10-CM | POA: Diagnosis not present

## 2021-01-04 DIAGNOSIS — I10 Essential (primary) hypertension: Secondary | ICD-10-CM | POA: Diagnosis not present

## 2021-01-04 DIAGNOSIS — E1165 Type 2 diabetes mellitus with hyperglycemia: Secondary | ICD-10-CM | POA: Diagnosis not present

## 2021-01-04 DIAGNOSIS — Z299 Encounter for prophylactic measures, unspecified: Secondary | ICD-10-CM | POA: Diagnosis not present

## 2021-01-28 DIAGNOSIS — G8929 Other chronic pain: Secondary | ICD-10-CM | POA: Diagnosis not present

## 2021-01-28 DIAGNOSIS — I10 Essential (primary) hypertension: Secondary | ICD-10-CM | POA: Diagnosis not present

## 2021-01-28 DIAGNOSIS — M549 Dorsalgia, unspecified: Secondary | ICD-10-CM | POA: Diagnosis not present

## 2021-01-28 DIAGNOSIS — E1165 Type 2 diabetes mellitus with hyperglycemia: Secondary | ICD-10-CM | POA: Diagnosis not present

## 2021-01-28 DIAGNOSIS — Z299 Encounter for prophylactic measures, unspecified: Secondary | ICD-10-CM | POA: Diagnosis not present

## 2021-01-28 DIAGNOSIS — Z79899 Other long term (current) drug therapy: Secondary | ICD-10-CM | POA: Diagnosis not present

## 2021-02-25 NOTE — Progress Notes (Deleted)
Cardiology Office Note  Date: 02/25/2021   ID: Wesley Garrison, DOB January 23, 1925, MRN [id number]  PCP:  Glenda Chroman, MD  Cardiologist:  Rozann Lesches, MD Electrophysiologist:  None   Chief Complaint: Follow-up  History of Present Illness: Wesley Garrison is a 87 y.o. male with a history of CAD, DM 2, mitral regurgitation, dyspnea, hyperlipidemia, HTN, GERD, gout.  When he was last seen by Dr. Bronson Ing he denied any chest pain, shortness of breath, palpitations, dizziness.  EKG showed first-degree AV block rate of 57 with PR interval 20 ms.  T wave inversions inferiorly and V4 to V6.  Compared to previous EKG January 2018 findings were similar.  He was continuing aspirin 81 mg daily, metoprolol 12.5 mg p.o. twice daily, and atorvastatin.  Due to previously abnormal stress test in 2014 without high risk features and evidence of coronary artery calcification on CT scan Dr. Bronson Ing thought due to his age, for functional capacity and no convincing symptoms of angina medical therapy would be the best option his situation.  His mitral valve regurgitation was moderate when last checked and asymptomatic.  No murmur was heard during exam.  Blood pressure was mildly elevated and plan was to continue monitoring given reduction in beta-blocker dose.  To continue atorvastatin for hyperlipidemia.    Past Medical History:  Diagnosis Date   Arthritis    Diarrhea 08/04/2017   DM type 2 (diabetes mellitus, type 2) (HCC)    for at least 10 years   GERD (gastroesophageal reflux disease)    Gout    History of kidney stones    HOH (hard of hearing)    Hypertension     Past Surgical History:  Procedure Laterality Date   ANKLE SURGERY Right 1961   removal of ankle bone   CATARACT EXTRACTION W/PHACO  03/11/2012   Procedure: CATARACT EXTRACTION PHACO AND INTRAOCULAR LENS PLACEMENT ([place]);  Surgeon: Tonny Branch, MD;  Location: [facility];  Service: Ophthalmology;  Laterality: Left;  CDE 11.09    CATARACT EXTRACTION W/PHACO Right 11/02/2019   Procedure: CATARACT EXTRACTION PHACO AND INTRAOCULAR LENS PLACEMENT RIGHT EYE;  Surgeon: Baruch Goldmann, MD;  Location: [facility];  Service: Ophthalmology;  Laterality: Right;  CDE: 26.18   COLONOSCOPY     COLONOSCOPY N/A 03/01/2013   Procedure: COLONOSCOPY;  Surgeon: Rogene Houston, MD;  Location: [facility];  Service: Endoscopy;  Laterality: N/A;  730   ESOPHAGOGASTRODUODENOSCOPY N/A 09/10/2017   Procedure: ESOPHAGOGASTRODUODENOSCOPY (EGD);  Surgeon: Rogene Houston, MD;  Location: [facility];  Service: Endoscopy;  Laterality: N/A;  245   EYE SURGERY     JOINT REPLACEMENT Bilateral    RIght hip 12-2013 and bilateral [id number]   spinal cyst  1946   TOTAL HIP ARTHROPLASTY Right 12/22/2013   Procedure: RIGHT TOTAL HIP ARTHROPLASTY ANTERIOR APPROACH;  Surgeon: Gearlean Alf, MD;  Location: [facility];  Service: Orthopedics;  Laterality: Right;    Current Outpatient Medications  Medication Sig Dispense Refill   amLODipine (NORVASC) 5 MG tablet Take 5 mg by mouth daily.     aspirin EC 81 MG tablet Take 162 mg by mouth daily.      atorvastatin (LIPITOR) 20 MG tablet Take 20 mg by mouth daily.      fluticasone (FLONASE) 50 MCG/ACT nasal spray Place 2 sprays into both nostrils daily as needed for allergies.      GLIPIZIDE XL 5 MG 24 hr tablet Take 5-10 mg by mouth See admin instructions.  Take 10 mg by mouth in the morning and take 5 mg by mouth in the evening     JANUVIA 100 MG tablet Take 100 mg by mouth daily.      lisinopril (PRINIVIL,ZESTRIL) 20 MG tablet Take 20 mg by mouth 2 (two) times daily.     metoprolol tartrate (LOPRESSOR) 50 MG tablet Take 50 mg by mouth 2 (two) times daily.     pantoprazole (PROTONIX) 40 MG tablet Take 40 mg by mouth every morning.     Tamsulosin HCl (FLOMAX) 0.4 MG CAPS Take 0.4 mg by mouth 2 (two) times daily.      No current facility-administered medications for this visit.   Facility-Administered Medications  Ordered in Other Visits  Medication Dose Route Frequency Provider Last Rate Last Admin   tranexamic acid (CYKLOKAPRON) 2,000 mg in sodium chloride 0.9 % 50 mL Topical Application  9,381 mg Topical Once [place], [employer], PA-C       Allergies:  Patient has no known allergies.   Social History: The patient  reports that he quit smoking about 72 years ago. His smoking use included cigarettes. He started smoking about 78 years ago. He quit smokeless tobacco use about 18 years ago.  His smokeless tobacco use included chew. He reports that he does not drink alcohol and does not use drugs.   Family History: The patient's family history includes Diabetes in his mother.   ROS:  Please see the history of present illness. Otherwise, complete review of systems is positive for {NONE DEFAULTED:18576}.  All other systems are reviewed and negative.   Physical Exam: VS:  There were no vitals taken for this visit., BMI There is no height or weight on file to calculate BMI.  Wt Readings from Last 3 Encounters:  10/05/19 169 lb 4.8 oz (76.8 kg)  08/12/19 176 lb (79.8 kg)  05/11/19 174 lb 11.2 oz (79.2 kg)    General: Patient appears comfortable at rest. HEENT: Conjunctiva and lids normal, oropharynx clear with moist mucosa. Neck: Supple, no elevated JVP or carotid bruits, no thyromegaly. Lungs: Clear to auscultation, nonlabored breathing at rest. Cardiac: Regular rate and rhythm, no S3 or significant systolic murmur, no pericardial rub. Abdomen: Soft, nontender, no hepatomegaly, bowel sounds present, no guarding or rebound. Extremities: No pitting edema, distal pulses 2+. Skin: Warm and dry. Musculoskeletal: No kyphosis. Neuropsychiatric: Alert and oriented x3, affect grossly appropriate.  ECG:  {EKG/Telemetry Strips Reviewed:[phone]}  Recent Labwork: No results found for requested labs within last 8760 hours.  No results found for: CHOL, TRIG, HDL, CHOLHDL, VLDL, LDLCALC, LDLDIRECT  Other  Studies Reviewed Today:  He had a pharmacologic nuclear stress test in 07/2012 which showed mild partially reversible anteroseptal and inferior defects with normal EF, with no high risk features.     He had an echocardiogram in 2014 performed for dyspnea which showed normal LV systolic function, moderate mitral regurgitation, mild aortic regurgitation and mild pulmonary hypertension. He had a CT of the chest which showed atherosclerosis and calcifications of the RCA and LAD  Assessment and Plan:  1. CAD in native artery   2. Nonrheumatic mitral valve regurgitation   3. Essential hypertension   4. Bradycardia   5. Dizziness    1. CAD in native artery ***  2. Nonrheumatic mitral valve regurgitation ***  3. Essential hypertension ***  4. Bradycardia ***  5. Dizziness ***   Medication Adjustments/Labs and Tests Ordered: Current medicines are reviewed at length with the patient today.  Concerns regarding  medicines are outlined above.   Disposition: Follow-up with ***  Signed, Levell July, NP 02/25/2021 4:47 PM    [facility] at [facility] [place], [place], [place] [address] Phone: [phone]; Fax: [phone]

## 2021-02-26 ENCOUNTER — Ambulatory Visit: Payer: TRICARE For Life (TFL) | Admitting: Family Medicine

## 2021-02-26 DIAGNOSIS — R42 Dizziness and giddiness: Secondary | ICD-10-CM

## 2021-02-26 DIAGNOSIS — I2510 Atherosclerotic heart disease of native coronary artery without angina pectoris: Secondary | ICD-10-CM

## 2021-02-26 DIAGNOSIS — I10 Essential (primary) hypertension: Secondary | ICD-10-CM

## 2021-02-26 DIAGNOSIS — I340 Nonrheumatic mitral (valve) insufficiency: Secondary | ICD-10-CM

## 2021-02-26 DIAGNOSIS — R001 Bradycardia, unspecified: Secondary | ICD-10-CM

## 2021-03-11 ENCOUNTER — Encounter: Payer: Self-pay | Admitting: Cardiology

## 2021-03-11 DIAGNOSIS — M48061 Spinal stenosis, lumbar region without neurogenic claudication: Secondary | ICD-10-CM | POA: Diagnosis not present

## 2021-03-11 DIAGNOSIS — E7800 Pure hypercholesterolemia, unspecified: Secondary | ICD-10-CM | POA: Diagnosis not present

## 2021-03-11 DIAGNOSIS — I10 Essential (primary) hypertension: Secondary | ICD-10-CM | POA: Diagnosis not present

## 2021-03-11 DIAGNOSIS — Z299 Encounter for prophylactic measures, unspecified: Secondary | ICD-10-CM | POA: Diagnosis not present

## 2021-03-11 DIAGNOSIS — Z7189 Other specified counseling: Secondary | ICD-10-CM | POA: Diagnosis not present

## 2021-03-11 DIAGNOSIS — M179 Osteoarthritis of knee, unspecified: Secondary | ICD-10-CM | POA: Diagnosis not present

## 2021-03-11 DIAGNOSIS — R5383 Other fatigue: Secondary | ICD-10-CM | POA: Diagnosis not present

## 2021-03-11 DIAGNOSIS — Z79899 Other long term (current) drug therapy: Secondary | ICD-10-CM | POA: Diagnosis not present

## 2021-03-11 DIAGNOSIS — Z0000 Encounter for general adult medical examination without abnormal findings: Secondary | ICD-10-CM | POA: Diagnosis not present

## 2021-03-13 ENCOUNTER — Encounter: Payer: Self-pay | Admitting: *Deleted

## 2021-03-13 ENCOUNTER — Ambulatory Visit (INDEPENDENT_AMBULATORY_CARE_PROVIDER_SITE_OTHER): Payer: Medicare Other | Admitting: Cardiology

## 2021-03-13 ENCOUNTER — Encounter: Payer: Self-pay | Admitting: Cardiology

## 2021-03-13 VITALS — BP 148/74 | HR 54 | Ht 69.0 in | Wt 168.4 lb

## 2021-03-13 DIAGNOSIS — E782 Mixed hyperlipidemia: Secondary | ICD-10-CM | POA: Diagnosis not present

## 2021-03-13 DIAGNOSIS — I10 Essential (primary) hypertension: Secondary | ICD-10-CM

## 2021-03-13 DIAGNOSIS — I259 Chronic ischemic heart disease, unspecified: Secondary | ICD-10-CM | POA: Diagnosis not present

## 2021-03-13 NOTE — Progress Notes (Signed)
Cardiology Office Note  Date: 03/13/2021   ID: Wesley Garrison, DOB Aug 14, 1924, MRN [id number]  PCP:  Glenda Chroman, MD  Cardiologist:  Rozann Lesches, MD Electrophysiologist:  None   Chief Complaint  Patient presents with   Cardiac follow-up     History of Present Illness: Wesley Garrison is a 87 y.o. male former patient of Dr. Bronson Ing now presenting to establish follow-up with me.  I reviewed his records and updated the chart.  He was last seen in April 2021.  He is here today with his wife.  He does not describe any angina symptoms and reports compliance with his medications.  He just had a follow-up visit with Dr. Woody Seller yesterday, we are requesting his lab work for review.  No changes were made in his medications.  He does use a walker at home, has had some falls, fortunately without any obvious fractures or serious injuries.  I personally reviewed his ECG today which shows sinus bradycardia with prolonged PR interval and nonspecific ST-T changes.  Past Medical History:  Diagnosis Date   Arthritis    DM type 2 (diabetes mellitus, type 2) ([place])    Essential hypertension    GERD (gastroesophageal reflux disease)    Gout    History of kidney stones    HOH (hard of hearing)    Ischemic heart disease    Mitral regurgitation     Past Surgical History:  Procedure Laterality Date   ANKLE SURGERY Right 1961   removal of ankle bone   CATARACT EXTRACTION W/PHACO  03/11/2012   Procedure: CATARACT EXTRACTION PHACO AND INTRAOCULAR LENS PLACEMENT ([place]);  Surgeon: Tonny Branch, MD;  Location: [facility];  Service: Ophthalmology;  Laterality: Left;  CDE 11.09   CATARACT EXTRACTION W/PHACO Right 11/02/2019   Procedure: CATARACT EXTRACTION PHACO AND INTRAOCULAR LENS PLACEMENT RIGHT EYE;  Surgeon: Baruch Goldmann, MD;  Location: [facility];  Service: Ophthalmology;  Laterality: Right;  CDE: 26.18   COLONOSCOPY     COLONOSCOPY N/A 03/01/2013   Procedure: COLONOSCOPY;  Surgeon: Rogene Houston, MD;  Location: [facility];  Service: Endoscopy;  Laterality: N/A;  730   ESOPHAGOGASTRODUODENOSCOPY N/A 09/10/2017   Procedure: ESOPHAGOGASTRODUODENOSCOPY (EGD);  Surgeon: Rogene Houston, MD;  [facility]: [facility];  Service: Endoscopy;  Laterality: N/A;  245   EYE SURGERY     JOINT REPLACEMENT Bilateral    RIght hip 12-2013 and bilateral [id number]   spinal cyst  1946   TOTAL HIP ARTHROPLASTY Right 12/22/2013   Procedure: RIGHT TOTAL HIP ARTHROPLASTY ANTERIOR APPROACH;  Surgeon: Gearlean Alf, MD;  Location: [facility];  Service: Orthopedics;  Laterality: Right;    Current Outpatient Medications  Medication Sig Dispense Refill   amLODipine (NORVASC) 5 MG tablet Take 5 mg by mouth daily.     aspirin EC 81 MG tablet Take 162 mg by mouth daily.      atorvastatin (LIPITOR) 20 MG tablet Take 20 mg by mouth daily.      fluticasone (FLONASE) 50 MCG/ACT nasal spray Place 2 sprays into both nostrils daily as needed for allergies.      GLIPIZIDE XL 5 MG 24 hr tablet Take 5-10 mg by mouth See admin instructions. Take 10 mg by mouth in the morning and take 5 mg by mouth in the evening     JANUVIA 100 MG tablet Take 100 mg by mouth daily.      lisinopril (PRINIVIL,ZESTRIL) 20 MG tablet Take 20 mg by  mouth 2 (two) times daily.     metoprolol tartrate (LOPRESSOR) 50 MG tablet Take 50 mg by mouth 2 (two) times daily.     oxyCODONE (OXY IR/ROXICODONE) 5 MG immediate release tablet Take 5 mg by mouth 3 (three) times daily as needed.     pantoprazole (PROTONIX) 40 MG tablet Take 40 mg by mouth every morning.     Tamsulosin HCl (FLOMAX) 0.4 MG CAPS Take 0.4 mg by mouth 2 (two) times daily.      No current facility-administered medications for this visit.   Facility-Administered Medications Ordered in Other Visits  Medication Dose Route Frequency Provider Last Rate Last Admin   tranexamic acid (CYKLOKAPRON) 2,000 mg in sodium chloride 0.9 % 50 mL Topical Application  2,458 mg Topical Once  [place], [employer], PA-C       Allergies:  Patient has no known allergies.   ROS: Chronic hearing loss.  Arthritis.  Physical Exam: VS:  BP (!) 148/74   Pulse (!) 54   Ht 5\' 9"  (1.753 m)   Wt 168 lb 6.4 oz (76.4 kg)   SpO2 98%   BMI 24.87 kg/m , BMI Body mass index is 24.87 kg/m.  Wt Readings from Last 3 Encounters:  03/13/21 168 lb 6.4 oz (76.4 kg)  10/05/19 169 lb 4.8 oz (76.8 kg)  08/12/19 176 lb (79.8 kg)    General: Elderly male, appears comfortable at rest. HEENT: Conjunctiva and lids normal, wearing a mask. Neck: Supple, no elevated JVP or carotid bruits, no thyromegaly. Lungs: Clear to auscultation, nonlabored breathing at rest. Cardiac: Regular rate and rhythm, no S3, 1/6 systolic murmur, no pericardial rub. Extremities: No pitting edema, arthritic joint changes.  ECG:  An ECG dated 08/12/2019 was personally reviewed today and demonstrated:  Sinus bradycardia with prolonged PR interval and nonspecific ST-T changes.  Recent Labwork:  June 2021: Potassium 3.9, BUN 13, creatinine 0.57 July 2021: Hemoglobin 14.2, platelets 171  Other Studies Reviewed Today:  Lexiscan Myoview 07/29/2012 [facility]): No diagnostic ST segment changes indicate ischemia.  Small apical anteroseptal perfusion defect consistent with mild ischemia.  Small mid to basal inferolateral defect consistent with ischemia.  LVEF 53%. TID ratio 1.13.  Assessment and Plan:  1.  Ischemic heart disease based on previous noninvasive cardiac imaging in 2014.  He has been managed conservatively with medical therapy over time per chart review.  At this point reports no active angina.  ECG reviewed.  Continue aspirin, lisinopril, Lipitor, Norvasc, and Lopressor.  2.  Mixed hyperlipidemia, tolerating Lipitor.  Requesting interval lab work from PCP for review.  3.  Essential hypertension, systolic is in the 099I today.  He remains on Norvasc, lisinopril, and Lopressor.  Follow-up with PCP.  Medication  Adjustments/Labs and Tests Ordered: Current medicines are reviewed at length with the patient today.  Concerns regarding medicines are outlined above.   Tests Ordered: Orders Placed This Encounter  Procedures   EKG 12-Lead     Medication Changes: No orders of the defined types were placed in this encounter.   Disposition:  Follow up  1 year, sooner if needed  Signed, Satira Sark, MD, [facility] 03/13/2021 2:18 PM    [facility], [place], [place] [address] Phone: [phone]; Fax: [phone]

## 2021-03-13 NOTE — Patient Instructions (Addendum)

## 2021-04-23 DIAGNOSIS — I10 Essential (primary) hypertension: Secondary | ICD-10-CM | POA: Diagnosis not present

## 2021-04-23 DIAGNOSIS — R52 Pain, unspecified: Secondary | ICD-10-CM | POA: Diagnosis not present

## 2021-04-23 DIAGNOSIS — Z299 Encounter for prophylactic measures, unspecified: Secondary | ICD-10-CM | POA: Diagnosis not present

## 2021-04-23 DIAGNOSIS — M545 Low back pain, unspecified: Secondary | ICD-10-CM | POA: Diagnosis not present

## 2021-04-23 DIAGNOSIS — E1165 Type 2 diabetes mellitus with hyperglycemia: Secondary | ICD-10-CM | POA: Diagnosis not present

## 2021-05-30 DIAGNOSIS — M5417 Radiculopathy, lumbosacral region: Secondary | ICD-10-CM | POA: Diagnosis not present

## 2021-05-30 DIAGNOSIS — R2681 Unsteadiness on feet: Secondary | ICD-10-CM | POA: Diagnosis not present

## 2021-05-30 DIAGNOSIS — M25561 Pain in right knee: Secondary | ICD-10-CM | POA: Diagnosis not present

## 2021-05-30 DIAGNOSIS — M25552 Pain in left hip: Secondary | ICD-10-CM | POA: Diagnosis not present

## 2021-05-30 DIAGNOSIS — Z96651 Presence of right artificial knee joint: Secondary | ICD-10-CM | POA: Diagnosis not present

## 2021-05-30 DIAGNOSIS — M47817 Spondylosis without myelopathy or radiculopathy, lumbosacral region: Secondary | ICD-10-CM | POA: Diagnosis not present

## 2021-05-30 DIAGNOSIS — R200 Anesthesia of skin: Secondary | ICD-10-CM | POA: Diagnosis not present

## 2021-05-30 DIAGNOSIS — G8929 Other chronic pain: Secondary | ICD-10-CM | POA: Diagnosis not present

## 2021-05-30 DIAGNOSIS — R296 Repeated falls: Secondary | ICD-10-CM | POA: Diagnosis not present

## 2021-06-07 ENCOUNTER — Other Ambulatory Visit: Payer: Self-pay

## 2021-06-07 ENCOUNTER — Ambulatory Visit: Payer: Medicare Other | Attending: Family

## 2021-06-07 DIAGNOSIS — M6281 Muscle weakness (generalized): Secondary | ICD-10-CM | POA: Diagnosis not present

## 2021-06-07 DIAGNOSIS — R296 Repeated falls: Secondary | ICD-10-CM | POA: Diagnosis not present

## 2021-06-07 NOTE — Therapy (Signed)
[facility] [place], [place], [address] Phone: [phone]   Fax:  [phone]  Physical Therapy Evaluation  Patient Details  Name: Wesley Garrison DOBROWSKI MRN: [id number] Date of Birth: [birth year] Referring Provider (PT): Dulcy Fanny, [place]   Encounter Date: 06/07/2021   PT End of Session - 06/07/21 0936     Visit Number 1    Number of Visits 8    Date for PT Re-Evaluation 08/09/21    PT Start Time 9702    PT Stop Time 1020    PT Time Calculation (min) 32 min    Activity Tolerance Patient limited by pain    Behavior During Therapy Agitated             Past Medical History:  Diagnosis Date   Arthritis    DM type 2 (diabetes mellitus, type 2) ([place])    Essential hypertension    GERD (gastroesophageal reflux disease)    Gout    History of kidney stones    HOH (hard of hearing)    Ischemic heart disease    Mitral regurgitation     Past Surgical History:  Procedure Laterality Date   ANKLE SURGERY Right 1961   removal of ankle bone   CATARACT EXTRACTION W/PHACO  03/11/2012   Procedure: CATARACT EXTRACTION PHACO AND INTRAOCULAR LENS PLACEMENT ([place]);  Surgeon: Tonny Branch, MD;  [facility];  Service: Ophthalmology;  Laterality: Left;  CDE 11.09   CATARACT EXTRACTION W/PHACO Right 11/02/2019   Procedure: CATARACT EXTRACTION PHACO AND INTRAOCULAR LENS PLACEMENT RIGHT EYE;  Surgeon: Baruch Goldmann, MD;  Location: [facility];  Service: Ophthalmology;  Laterality: Right;  CDE: 26.18   COLONOSCOPY     COLONOSCOPY N/A 03/01/2013   Procedure: COLONOSCOPY;  Surgeon: Rogene Houston, MD;  [facility];  Service: Endoscopy;  Laterality: N/A;  730   ESOPHAGOGASTRODUODENOSCOPY N/A 09/10/2017   Procedure: ESOPHAGOGASTRODUODENOSCOPY (EGD);  Surgeon: Rogene Houston, MD;  [facility];  Service: Endoscopy;  Laterality: N/A;  245   EYE SURGERY     JOINT REPLACEMENT Bilateral    RIght hip 12-2013 and bilateral [id number]    spinal cyst  1946   TOTAL HIP ARTHROPLASTY Right 12/22/2013   Procedure: RIGHT TOTAL HIP ARTHROPLASTY ANTERIOR APPROACH;  Surgeon: Gearlean Alf, MD;  [facility];  Service: Orthopedics;  Laterality: Right;    There were no vitals filed for this visit.    Subjective Assessment - 06/07/21 0948     Subjective Patient reports that he has nerve damage in his right leg since he had a THA in 88 He notes that he has been falling since this happened with two falls in the past month. He was in the kitchen cooking with each of these falls. He reports that his legs will give out on him with no warning.    Pertinent History Right THA, bilateral TKA's, poor blood flow below his knees (per patient reports), hard of hearing, pain in low back, hips, bilateral knees, and left ankle    Limitations Walking;Standing    Patient Stated Goals improve his leg strength and not fall    Currently in Pain? Yes    Pain Score --   patient unable to provide an accurate pain score   Pain Location Leg    Pain Orientation Right    Pain Descriptors / Indicators Numbness    Pain Type Chronic pain    Pain Onset More than a month ago  Pain Frequency Constant    Aggravating Factors  walking    Effect of Pain on Daily Activities makes him fall                [facility] PT Assessment - 06/07/21 0001       Assessment   Medical Diagnosis Gait instability; frequent falls    Referring Provider (PT) Dulcy Fanny, FNP    Onset Date/Surgical Date --   2015   Next MD Visit about 6 weeks    Prior Therapy None since 2015   for his hip and low back     Precautions   Precautions Fall      Restrictions   Weight Bearing Restrictions No      Balance Screen   Has the patient fallen in the past 6 months Yes    How many times? 2    Has the patient had a decrease in activity level because of a fear of falling?  No    Is the patient reluctant to leave their home because of a fear of falling?  No      Home Environment    Living Environment Private residence    Living Arrangements Spouse/significant other    Type of [place] Access Level entry    [place] - single point;Walker - 2 wheels;Wheelchair - manual   will use the walker at home, but the cane when he is out     Prior Function   Level of Independence Independent with basic ADLs    Vocation Retired    Leisure none      Cognition   Overall Cognitive Status Impaired/Different from baseline    Area of Impairment Attention    Current Attention Level Selective    Memory Appears intact    Awareness Appears intact    Problem Solving Appears intact      Sensation   Light Touch --   unable to accurately be assessed as patient would open his eyes to see the test; he reported no deficiits to light touch   Additional Comments Patient reports numbness in RLE below the knee      ROM / Strength   AROM / PROM / Strength Strength      Strength   Strength Assessment Site Hip;Knee;Ankle    Right/Left Hip Right;Left    Right Hip Flexion 4-/5    Left Hip Flexion 4/5    Right/Left Knee Right;Left    Right Knee Flexion 4/5    Right Knee Extension 4+/5    Left Knee Flexion 4/5    Left Knee Extension 5/5    Right/Left Ankle Right;Left    Right Ankle Dorsiflexion 3+/5    Left Ankle Dorsiflexion 3+/5      Transfers   Transfers Sit to Stand;Stand to Sit    Sit to Stand 6: Modified independent (Device/Increase time);With upper extremity assist;With armrests    Five time sit to stand comments  17 seconds   unable to complete without UE support   Stand to Sit 6: Modified independent (Device/Increase time);With upper extremity assist;With armrests      Ambulation/Gait   Ambulation/Gait Yes    Ambulation/Gait Assistance 4: Min assist    Ambulation/Gait Assistance Details minA for safety    Assistive device Straight cane    Ambulation Surface Level;Indoor    Gait velocity decreased      Balance   Balance  Assessed Yes  Static Standing Balance   Static Standing - Balance Support No upper extremity supported    Static Standing - Level of Assistance 5: Stand by assistance    Static Standing Balance -  Activities  --   feet shoulder width apart: 30 seconds (further static standing assessment was limited by low back and lower extremity pain                       Objective measurements completed on examination: See above findings.       [facility] Adult PT Treatment/Exercise - 06/07/21 0001       Exercises   Exercises Knee/Hip      Knee/Hip Exercises: Seated   Long Arc Quad Both;10 reps   limited by right knee pain   Other Seated Knee/Hip Exercises Heel/toe raises   limited by left ankle pain; 10 reps each                         PT Long Term Goals - 06/07/21 1235       PT LONG TERM GOAL #1   Title Patient will be independent with his HEP.    Time 4    Period Weeks    Status New    Target Date 07/05/21      PT LONG TERM GOAL #2   Title Patient will be able to transfer from sitting to standing without upper extremity support.    Time 4    Period Weeks    Status New    Target Date 07/05/21      PT LONG TERM GOAL #3   Title Patient will be able to walk at least 80 feet without the need for assistance or external support for safety.    Time 4    Period Weeks    Status New    Target Date 07/05/21                    Plan - 06/07/21 1226     Clinical Impression Statement Patient is a 88 year old male presenting to physical therapy following multiple falls at home. His evaluation was limited due to pain in his low back, hips, right knee, and left ankle. He is a high fall risk as evidenced by his history of multiple falls, five time sit to stand, and his instability with walking. Recommend that he continue with skilled physical therapy to address his remaining impairments to improve his safety and maximize his functional mobility.     Personal Factors and Comorbidities Comorbidity 1;Comorbidity 2;Age;Fitness;Time since onset of injury/illness/exacerbation;Transportation    Comorbidities OA, diabetes    Examination-Activity Limitations Locomotion Level;Transfers;Caring for Others;Carry;Stand    Examination-Participation Restrictions Meal Prep;Cleaning;Community Activity    Stability/Clinical Decision Making Unstable/Unpredictable    Clinical Decision Making High    Rehab Potential Poor    PT Frequency 2x / week    PT Duration 4 weeks    PT Treatment/Interventions ADLs/Self Care Home Management;Gait training;Stair training;Functional mobility training;Therapeutic activities;Therapeutic exercise;Balance training;Neuromuscular re-education;Patient/family education;Energy conservation    PT Next Visit Plan nustep, lower extremity strengthening and balance interventions    Consulted and Agree with Plan of Care Patient             Patient will benefit from skilled therapeutic intervention in order to improve the following deficits and impairments:  Difficulty walking, Abnormal gait, Pain, Decreased activity tolerance, Decreased balance, Decreased strength, Decreased mobility  Visit Diagnosis:  Repeated falls  Muscle weakness (generalized)     Problem List Patient Active Problem List   Diagnosis Date Noted   Melena 08/13/2017   Diarrhea 08/04/2017   OA (osteoarthritis) of hip 12/22/2013   Heme positive stool 02/21/2013   Diabetes ([place]) 02/21/2013   High cholesterol 02/21/2013   Gout 02/21/2013   Mitral regurgitation 08/27/2012   Dyspnea 07/15/2012   Coronary atherosclerosis of native coronary artery 07/15/2012    Darlin Coco, PT 06/07/2021, 12:41 PM  [facility] [place], [place], [address] Phone: [phone]   Fax:  [phone]  Name: TYSIN SALADA MRN: [id number] Date of Birth: [birth year]

## 2021-06-11 ENCOUNTER — Other Ambulatory Visit: Payer: Self-pay

## 2021-06-11 ENCOUNTER — Ambulatory Visit: Payer: Medicare Other | Admitting: Physical Therapy

## 2021-06-11 ENCOUNTER — Encounter: Payer: Self-pay | Admitting: Physical Therapy

## 2021-06-11 DIAGNOSIS — R296 Repeated falls: Secondary | ICD-10-CM | POA: Diagnosis not present

## 2021-06-11 DIAGNOSIS — M6281 Muscle weakness (generalized): Secondary | ICD-10-CM | POA: Diagnosis not present

## 2021-06-11 NOTE — Therapy (Signed)
[facility] [place], [place], [address] Phone: [phone]   Fax:  [phone]  Physical Therapy Treatment  Patient Details  Name: Wesley Garrison MRN: [id number] Date of Birth: [birth year] Referring Provider (PT): Dulcy Fanny, [place]   Encounter Date: 06/11/2021   PT End of Session - 06/11/21 1039     Visit Number 2    Number of Visits 8    Date for PT Re-Evaluation 08/09/21    PT Start Time 3785    PT Stop Time 1105    PT Time Calculation (min) 28 min    Activity Tolerance Treatment limited secondary to agitation;Patient limited by fatigue    Behavior During Therapy Agitated             Past Medical History:  Diagnosis Date   Arthritis    DM type 2 (diabetes mellitus, type 2) ([place])    Essential hypertension    GERD (gastroesophageal reflux disease)    Gout    History of kidney stones    HOH (hard of hearing)    Ischemic heart disease    Mitral regurgitation     Past Surgical History:  Procedure Laterality Date   ANKLE SURGERY Right 1961   removal of ankle bone   CATARACT EXTRACTION W/PHACO  03/11/2012   Procedure: CATARACT EXTRACTION PHACO AND INTRAOCULAR LENS PLACEMENT ([place]);  Surgeon: Tonny Branch, MD;  Location: [facility];  Service: Ophthalmology;  Laterality: Left;  CDE 11.09   CATARACT EXTRACTION W/PHACO Right 11/02/2019   Procedure: CATARACT EXTRACTION PHACO AND INTRAOCULAR LENS PLACEMENT RIGHT EYE;  Surgeon: Baruch Goldmann, MD;  Location: [facility];  Service: Ophthalmology;  Laterality: Right;  CDE: 26.18   COLONOSCOPY     COLONOSCOPY N/A 03/01/2013   Procedure: COLONOSCOPY;  Surgeon: Rogene Houston, MD;  Location[facility];  Service: Endoscopy;  Laterality: N/A;  730   ESOPHAGOGASTRODUODENOSCOPY N/A 09/10/2017   Procedure: ESOPHAGOGASTRODUODENOSCOPY (EGD);  Surgeon: Rogene Houston, MD;  [facility];  Service: Endoscopy;  Laterality: N/A;  245   EYE SURGERY     JOINT REPLACEMENT Bilateral     RIght hip 12-2013 and bilateral [id number]   spinal cyst  1946   TOTAL HIP ARTHROPLASTY Right 12/22/2013   Procedure: RIGHT TOTAL HIP ARTHROPLASTY ANTERIOR APPROACH;  Surgeon: Gearlean Alf, MD;  [facility];  Service: Orthopedics;  Laterality: Right;    There were no vitals filed for this visit.   Subjective Assessment - 06/11/21 1038     Subjective Reports LBP and LE pain 9/10.    Pertinent History Right THA, bilateral TKA's, poor blood flow below his knees (per patient reports), hard of hearing, pain in low back, hips, bilateral knees, and left ankle    Limitations Walking;Standing    Patient Stated Goals improve his leg strength and not fall    Currently in Pain? Yes    Pain Score 9     Pain Location Back   and LEs   Pain Orientation Lower    Pain Descriptors / Indicators Discomfort    Pain Type Chronic pain    Pain Onset More than a month ago    Pain Frequency Constant                [facility] PT Assessment - 06/11/21 0001       Assessment   Medical Diagnosis Gait instability; frequent falls    Referring Provider (PT) Dulcy Fanny, FNP    Next MD Visit about  6 weeks    Prior Therapy None since 2015      Precautions   Precautions Fall      Restrictions   Weight Bearing Restrictions No                           [facility] Adult PT Treatment/Exercise - 06/11/21 0001       Knee/Hip Exercises: Aerobic   Nustep L2 x7.5 min d/t fatigue      Knee/Hip Exercises: Standing   Heel Raises Both;15 reps    Knee Flexion AROM;Both;15 reps    Hip Flexion AROM;Both;15 reps;Knee bent    Hip Abduction AROM;Both;20 reps;Knee straight      Knee/Hip Exercises: Seated   Long Arc Quad Strengthening;Both;20 reps;Limitations    Long Arc Quad Limitations red theraband    Clamshell with TheraBand Red   x30 reps   Other Seated Knee/Hip Exercises B hip D2 red theraband    Marching Strengthening;Both;20 reps;Limitations    Marching Limitations red theraband    Hamstring Curl  Strengthening;Both;20 reps;Limitations    Hamstring Limitations red theraband    Sit to Sand 10 reps;with UE support                          PT Long Term Goals - 06/07/21 1235       PT LONG TERM GOAL #1   Title Patient will be independent with his HEP.    Time 4    Period Weeks    Status New    Target Date 07/05/21      PT LONG TERM GOAL #2   Title Patient will be able to transfer from sitting to standing without upper extremity support.    Time 4    Period Weeks    Status New    Target Date 07/05/21      PT LONG TERM GOAL #3   Title Patient will be able to walk at least 80 feet without the need for assistance or external support for safety.    Time 4    Period Weeks    Status New    Target Date 07/05/21                   Plan - 06/11/21 1109     Clinical Impression Statement Patient presented in clinic with increased LBP and LE pain and numbness of RLE. Patient agitated overall as he states that he has been through PT before with no improvement. Patient guided through some resisted activities as well as standing activities but he fatigues fairly quickly. Patient reports that falls are a result of his LEs getting weak even if using an AD. Patient used [facility] to come to PT today but states that he uses a FWW at home but falls even with FWW. Patient able to stand from a fall independently.    Personal Factors and Comorbidities Comorbidity 1;Comorbidity 2;Age;Fitness;Time since onset of injury/illness/exacerbation;Transportation    Comorbidities OA, diabetes    Examination-Activity Limitations Locomotion Level;Transfers;Caring for Others;Carry;Stand    Examination-Participation Restrictions Meal Prep;Cleaning;Community Activity    Stability/Clinical Decision Making Unstable/Unpredictable    Rehab Potential Poor    PT Frequency 2x / week    PT Duration 4 weeks    PT Treatment/Interventions ADLs/Self Care Home Management;Gait training;Stair  training;Functional mobility training;Therapeutic activities;Therapeutic exercise;Balance training;Neuromuscular re-education;Patient/family education;Energy conservation    PT Next Visit Plan nustep, lower extremity strengthening and balance interventions  Consulted and Agree with Plan of Care Patient             Patient will benefit from skilled therapeutic intervention in order to improve the following deficits and impairments:  Difficulty walking, Abnormal gait, Pain, Decreased activity tolerance, Decreased balance, Decreased strength, Decreased mobility  Visit Diagnosis: Repeated falls  Muscle weakness (generalized)     Problem List Patient Active Problem List   Diagnosis Date Noted   Melena 08/13/2017   Diarrhea 08/04/2017   OA (osteoarthritis) of hip 12/22/2013   Heme positive stool 02/21/2013   Diabetes ([place]) 02/21/2013   High cholesterol 02/21/2013   Gout 02/21/2013   Mitral regurgitation 08/27/2012   Dyspnea 07/15/2012   Coronary atherosclerosis of native coronary artery 07/15/2012    Standley Brooking, PTA 06/11/2021, 11:15 AM  [facility] [address] [place], [place], [address] Phone: [phone]   Fax:  [phone]  Name: Wesley Garrison MRN: [id number] Date of Birth: April 23, 1925

## 2021-06-13 ENCOUNTER — Other Ambulatory Visit: Payer: Self-pay

## 2021-06-13 ENCOUNTER — Ambulatory Visit: Payer: Medicare Other

## 2021-06-13 DIAGNOSIS — M6281 Muscle weakness (generalized): Secondary | ICD-10-CM | POA: Diagnosis not present

## 2021-06-13 DIAGNOSIS — R296 Repeated falls: Secondary | ICD-10-CM | POA: Diagnosis not present

## 2021-06-13 NOTE — Therapy (Addendum)
[facility] [place], [place], [address] Phone: [phone]   Fax:  [phone]  Physical Therapy Treatment  Patient Details  Name: Wesley Garrison MRN: [id number] Date of Birth: [birth year] Referring Provider (PT): Dulcy Fanny, [place]   Encounter Date: 06/13/2021   PT End of Session - 06/13/21 0950     Visit Number 3    Number of Visits 8    Date for PT Re-Evaluation 08/09/21    PT Start Time 0945    PT Stop Time 4562    PT Time Calculation (min) 33 min    Activity Tolerance Treatment limited secondary to agitation;Patient limited by fatigue    Behavior During Therapy Agitated             Past Medical History:  Diagnosis Date   Arthritis    DM type 2 (diabetes mellitus, type 2) ([place])    Essential hypertension    GERD (gastroesophageal reflux disease)    Gout    History of kidney stones    HOH (hard of hearing)    Ischemic heart disease    Mitral regurgitation     Past Surgical History:  Procedure Laterality Date   ANKLE SURGERY Right 1961   removal of ankle bone   CATARACT EXTRACTION W/PHACO  03/11/2012   Procedure: CATARACT EXTRACTION PHACO AND INTRAOCULAR LENS PLACEMENT ([place]);  Surgeon: Tonny Branch, MD;  Location: [facility];  Service: Ophthalmology;  Laterality: Left;  CDE 11.09   CATARACT EXTRACTION W/PHACO Right 11/02/2019   Procedure: CATARACT EXTRACTION PHACO AND INTRAOCULAR LENS PLACEMENT RIGHT EYE;  Surgeon: Baruch Goldmann, MD;  Location: [facility];  Service: Ophthalmology;  Laterality: Right;  CDE: 26.18   COLONOSCOPY     COLONOSCOPY N/A 03/01/2013   Procedure: COLONOSCOPY;  Surgeon: Rogene Houston, MD;  Location: [facility];  Service: Endoscopy;  Laterality: N/A;  730   ESOPHAGOGASTRODUODENOSCOPY N/A 09/10/2017   Procedure: ESOPHAGOGASTRODUODENOSCOPY (EGD);  Surgeon: Rogene Houston, MD;  Location: [facility];  Service: Endoscopy;  Laterality: N/A;  245   EYE SURGERY     JOINT REPLACEMENT Bilateral     RIght hip 12-2013 and bilateral [id number]   spinal cyst  1946   TOTAL HIP ARTHROPLASTY Right 12/22/2013   Procedure: RIGHT TOTAL HIP ARTHROPLASTY ANTERIOR APPROACH;  Surgeon: Gearlean Alf, MD;  Location: [facility];  Service: Orthopedics;  Laterality: Right;    There were no vitals filed for this visit.   Subjective Assessment - 06/13/21 0948     Subjective Patient reports that his last appointment was alright. He reports that today is "rough" due to his right leg.    Pertinent History Right THA, bilateral TKA's, poor blood flow below his knees (per patient reports), hard of hearing, pain in low back, hips, bilateral knees, and left ankle    Limitations Walking;Standing    Patient Stated Goals improve his leg strength and not fall    Currently in Pain? Yes    Pain Location Leg    Pain Orientation Right    Pain Onset More than a month ago                               [facility] Adult PT Treatment/Exercise - 06/13/21 0001       Knee/Hip Exercises: Stretches   Passive Hamstring Stretch Both;60 seconds;2 reps      Knee/Hip Exercises: Aerobic   Nustep L3 x 15 minutes  Knee/Hip Exercises: Seated   Long Arc Quad Both;20 reps    Long [employer] Limitations limited by pulling in quads (R>L)    Clamshell with TheraBand --   30 reps   Marching Both;20 reps                          PT Long Term Goals - 06/07/21 1235       PT LONG TERM GOAL #1   Title Patient will be independent with his HEP.    Time 4    Period Weeks    Status New    Target Date 07/05/21      PT LONG TERM GOAL #2   Title Patient will be able to transfer from sitting to standing without upper extremity support.    Time 4    Period Weeks    Status New    Target Date 07/05/21      PT LONG TERM GOAL #3   Title Patient will be able to walk at least 80 feet without the need for assistance or external support for safety.    Time 4    Period Weeks    Status New    Target Date  07/05/21                   Plan - 06/13/21 0951     Clinical Impression Statement Patient was progressed with multiple new and familiar interventions for improved lower extremity strength and endurance. He was limited with multiple interventions by quadriceps "pulling." He reported feeling "bad" upon the conclusion of treatment resulting in no other new activities being introduced. He continues to require skilled physical therapy to address his remaining impairments to improve his safety and maximize his mobility.    Personal Factors and Comorbidities Comorbidity 1;Comorbidity 2;Age;Fitness;Time since onset of injury/illness/exacerbation;Transportation    Comorbidities OA, diabetes    Examination-Activity Limitations Locomotion Level;Transfers;Caring for Others;Carry;Stand    Examination-Participation Restrictions Meal Prep;Cleaning;Community Activity    Stability/Clinical Decision Making Unstable/Unpredictable    Rehab Potential Poor    PT Frequency 2x / week    PT Duration 4 weeks    PT Treatment/Interventions ADLs/Self Care Home Management;Gait training;Stair training;Functional mobility training;Therapeutic activities;Therapeutic exercise;Balance training;Neuromuscular re-education;Patient/family education;Energy conservation    PT Next Visit Plan nustep, lower extremity strengthening and balance interventions    Consulted and Agree with Plan of Care Patient             Patient will benefit from skilled therapeutic intervention in order to improve the following deficits and impairments:  Difficulty walking, Abnormal gait, Pain, Decreased activity tolerance, Decreased balance, Decreased strength, Decreased mobility  Visit Diagnosis: Repeated falls  Muscle weakness (generalized)     Problem List Patient Active Problem List   Diagnosis Date Noted   Melena 08/13/2017   Diarrhea 08/04/2017   OA (osteoarthritis) of hip 12/22/2013   Heme positive stool 02/21/2013    Diabetes ([place]) 02/21/2013   High cholesterol 02/21/2013   Gout 02/21/2013   Mitral regurgitation 08/27/2012   Dyspnea 07/15/2012   Coronary atherosclerosis of native coronary artery 07/15/2012    Darlin Coco, PT 06/13/2021, 5:00 PM  [facility] [address] [place], [place], [address] Phone: [phone]   Fax:  [phone]  Name: Wesley Garrison MRN: [id number] Date of Birth: [birth year]  PHYSICAL THERAPY DISCHARGE SUMMARY  Visits from Start of Care: 3  Current functional level related to goals / functional outcomes:  Patient is being discharged at this time as he has not returned since his last appointment.    Remaining deficits: See evaluation    Education / Equipment: HEP   Patient agrees to discharge. Patient goals were not met. Patient is being discharged due to not returning since the last visit.

## 2021-06-18 ENCOUNTER — Ambulatory Visit: Payer: Medicare Other

## 2021-07-11 DIAGNOSIS — R2681 Unsteadiness on feet: Secondary | ICD-10-CM | POA: Diagnosis not present

## 2021-07-11 DIAGNOSIS — R200 Anesthesia of skin: Secondary | ICD-10-CM | POA: Diagnosis not present

## 2021-07-11 DIAGNOSIS — R296 Repeated falls: Secondary | ICD-10-CM | POA: Diagnosis not present

## 2021-07-11 DIAGNOSIS — M47817 Spondylosis without myelopathy or radiculopathy, lumbosacral region: Secondary | ICD-10-CM | POA: Diagnosis not present

## 2021-07-11 DIAGNOSIS — M5417 Radiculopathy, lumbosacral region: Secondary | ICD-10-CM | POA: Diagnosis not present

## 2021-07-26 DIAGNOSIS — E1165 Type 2 diabetes mellitus with hyperglycemia: Secondary | ICD-10-CM | POA: Diagnosis not present

## 2021-07-26 DIAGNOSIS — R809 Proteinuria, unspecified: Secondary | ICD-10-CM | POA: Diagnosis not present

## 2021-07-26 DIAGNOSIS — R350 Frequency of micturition: Secondary | ICD-10-CM | POA: Diagnosis not present

## 2021-07-26 DIAGNOSIS — I10 Essential (primary) hypertension: Secondary | ICD-10-CM | POA: Diagnosis not present

## 2021-07-26 DIAGNOSIS — Z299 Encounter for prophylactic measures, unspecified: Secondary | ICD-10-CM | POA: Diagnosis not present

## 2021-07-26 DIAGNOSIS — E1129 Type 2 diabetes mellitus with other diabetic kidney complication: Secondary | ICD-10-CM | POA: Diagnosis not present

## 2021-08-16 DIAGNOSIS — E119 Type 2 diabetes mellitus without complications: Secondary | ICD-10-CM | POA: Diagnosis not present

## 2021-08-16 DIAGNOSIS — I10 Essential (primary) hypertension: Secondary | ICD-10-CM | POA: Diagnosis not present

## 2021-08-16 DIAGNOSIS — K219 Gastro-esophageal reflux disease without esophagitis: Secondary | ICD-10-CM | POA: Diagnosis not present

## 2021-08-16 DIAGNOSIS — E785 Hyperlipidemia, unspecified: Secondary | ICD-10-CM | POA: Diagnosis not present

## 2021-08-22 DIAGNOSIS — M5417 Radiculopathy, lumbosacral region: Secondary | ICD-10-CM | POA: Diagnosis not present

## 2021-08-22 DIAGNOSIS — R200 Anesthesia of skin: Secondary | ICD-10-CM | POA: Diagnosis not present

## 2021-08-22 DIAGNOSIS — R296 Repeated falls: Secondary | ICD-10-CM | POA: Diagnosis not present

## 2021-08-22 DIAGNOSIS — M47817 Spondylosis without myelopathy or radiculopathy, lumbosacral region: Secondary | ICD-10-CM | POA: Diagnosis not present

## 2021-08-22 DIAGNOSIS — M25552 Pain in left hip: Secondary | ICD-10-CM | POA: Diagnosis not present

## 2021-08-22 DIAGNOSIS — R2681 Unsteadiness on feet: Secondary | ICD-10-CM | POA: Diagnosis not present

## 2021-09-03 DIAGNOSIS — K219 Gastro-esophageal reflux disease without esophagitis: Secondary | ICD-10-CM | POA: Diagnosis not present

## 2021-09-03 DIAGNOSIS — E785 Hyperlipidemia, unspecified: Secondary | ICD-10-CM | POA: Diagnosis not present

## 2021-09-03 DIAGNOSIS — E119 Type 2 diabetes mellitus without complications: Secondary | ICD-10-CM | POA: Diagnosis not present

## 2021-09-03 DIAGNOSIS — I10 Essential (primary) hypertension: Secondary | ICD-10-CM | POA: Diagnosis not present

## 2021-09-05 DIAGNOSIS — M4726 Other spondylosis with radiculopathy, lumbar region: Secondary | ICD-10-CM | POA: Diagnosis not present

## 2021-09-05 DIAGNOSIS — M1612 Unilateral primary osteoarthritis, left hip: Secondary | ICD-10-CM | POA: Diagnosis not present

## 2021-09-05 DIAGNOSIS — G894 Chronic pain syndrome: Secondary | ICD-10-CM | POA: Diagnosis not present

## 2021-09-05 DIAGNOSIS — M5459 Other low back pain: Secondary | ICD-10-CM | POA: Diagnosis not present

## 2021-09-05 DIAGNOSIS — M5136 Other intervertebral disc degeneration, lumbar region: Secondary | ICD-10-CM | POA: Diagnosis not present

## 2021-09-05 DIAGNOSIS — M48061 Spinal stenosis, lumbar region without neurogenic claudication: Secondary | ICD-10-CM | POA: Diagnosis not present

## 2021-09-05 DIAGNOSIS — M48062 Spinal stenosis, lumbar region with neurogenic claudication: Secondary | ICD-10-CM | POA: Diagnosis not present

## 2021-09-10 DIAGNOSIS — M4726 Other spondylosis with radiculopathy, lumbar region: Secondary | ICD-10-CM | POA: Diagnosis not present

## 2021-09-10 DIAGNOSIS — M5136 Other intervertebral disc degeneration, lumbar region: Secondary | ICD-10-CM | POA: Diagnosis not present

## 2021-09-20 DIAGNOSIS — I10 Essential (primary) hypertension: Secondary | ICD-10-CM | POA: Diagnosis not present

## 2021-09-20 DIAGNOSIS — E119 Type 2 diabetes mellitus without complications: Secondary | ICD-10-CM | POA: Diagnosis not present

## 2021-09-20 DIAGNOSIS — K219 Gastro-esophageal reflux disease without esophagitis: Secondary | ICD-10-CM | POA: Diagnosis not present

## 2021-09-20 DIAGNOSIS — E785 Hyperlipidemia, unspecified: Secondary | ICD-10-CM | POA: Diagnosis not present

## 2021-10-02 DIAGNOSIS — E119 Type 2 diabetes mellitus without complications: Secondary | ICD-10-CM | POA: Diagnosis not present

## 2021-10-02 DIAGNOSIS — K219 Gastro-esophageal reflux disease without esophagitis: Secondary | ICD-10-CM | POA: Diagnosis not present

## 2021-10-02 DIAGNOSIS — I10 Essential (primary) hypertension: Secondary | ICD-10-CM | POA: Diagnosis not present

## 2021-10-02 DIAGNOSIS — E785 Hyperlipidemia, unspecified: Secondary | ICD-10-CM | POA: Diagnosis not present

## 2021-10-04 ENCOUNTER — Telehealth: Payer: Self-pay | Admitting: Cardiology

## 2021-10-04 NOTE — Telephone Encounter (Signed)
Question was in regards to his wife, not himself.

## 2021-10-04 NOTE — Telephone Encounter (Signed)
Patient called and wanted to ask a question to Dr. Domenic Polite or nurse. Please call back

## 2021-10-16 DIAGNOSIS — E785 Hyperlipidemia, unspecified: Secondary | ICD-10-CM | POA: Diagnosis not present

## 2021-10-16 DIAGNOSIS — I10 Essential (primary) hypertension: Secondary | ICD-10-CM | POA: Diagnosis not present

## 2021-10-16 DIAGNOSIS — K219 Gastro-esophageal reflux disease without esophagitis: Secondary | ICD-10-CM | POA: Diagnosis not present

## 2021-10-16 DIAGNOSIS — E119 Type 2 diabetes mellitus without complications: Secondary | ICD-10-CM | POA: Diagnosis not present

## 2021-10-22 DIAGNOSIS — M48061 Spinal stenosis, lumbar region without neurogenic claudication: Secondary | ICD-10-CM | POA: Diagnosis not present

## 2021-10-22 DIAGNOSIS — G894 Chronic pain syndrome: Secondary | ICD-10-CM | POA: Diagnosis not present

## 2021-10-22 DIAGNOSIS — M5459 Other low back pain: Secondary | ICD-10-CM | POA: Diagnosis not present

## 2021-10-22 DIAGNOSIS — M4726 Other spondylosis with radiculopathy, lumbar region: Secondary | ICD-10-CM | POA: Diagnosis not present

## 2021-10-22 DIAGNOSIS — M48062 Spinal stenosis, lumbar region with neurogenic claudication: Secondary | ICD-10-CM | POA: Diagnosis not present

## 2021-10-22 DIAGNOSIS — M5136 Other intervertebral disc degeneration, lumbar region: Secondary | ICD-10-CM | POA: Diagnosis not present

## 2021-10-30 DIAGNOSIS — E1165 Type 2 diabetes mellitus with hyperglycemia: Secondary | ICD-10-CM | POA: Diagnosis not present

## 2021-10-30 DIAGNOSIS — Z299 Encounter for prophylactic measures, unspecified: Secondary | ICD-10-CM | POA: Diagnosis not present

## 2021-10-30 DIAGNOSIS — I10 Essential (primary) hypertension: Secondary | ICD-10-CM | POA: Diagnosis not present

## 2021-11-01 DIAGNOSIS — E785 Hyperlipidemia, unspecified: Secondary | ICD-10-CM | POA: Diagnosis not present

## 2021-11-01 DIAGNOSIS — K219 Gastro-esophageal reflux disease without esophagitis: Secondary | ICD-10-CM | POA: Diagnosis not present

## 2021-11-01 DIAGNOSIS — E119 Type 2 diabetes mellitus without complications: Secondary | ICD-10-CM | POA: Diagnosis not present

## 2021-11-01 DIAGNOSIS — I10 Essential (primary) hypertension: Secondary | ICD-10-CM | POA: Diagnosis not present

## 2021-11-28 DIAGNOSIS — E785 Hyperlipidemia, unspecified: Secondary | ICD-10-CM | POA: Diagnosis not present

## 2021-11-28 DIAGNOSIS — I10 Essential (primary) hypertension: Secondary | ICD-10-CM | POA: Diagnosis not present

## 2021-11-28 DIAGNOSIS — E119 Type 2 diabetes mellitus without complications: Secondary | ICD-10-CM | POA: Diagnosis not present

## 2021-11-28 DIAGNOSIS — K219 Gastro-esophageal reflux disease without esophagitis: Secondary | ICD-10-CM | POA: Diagnosis not present

## 2021-12-01 DIAGNOSIS — E119 Type 2 diabetes mellitus without complications: Secondary | ICD-10-CM | POA: Diagnosis not present

## 2021-12-01 DIAGNOSIS — K219 Gastro-esophageal reflux disease without esophagitis: Secondary | ICD-10-CM | POA: Diagnosis not present

## 2021-12-01 DIAGNOSIS — E785 Hyperlipidemia, unspecified: Secondary | ICD-10-CM | POA: Diagnosis not present

## 2021-12-01 DIAGNOSIS — I10 Essential (primary) hypertension: Secondary | ICD-10-CM | POA: Diagnosis not present

## 2021-12-02 DIAGNOSIS — E119 Type 2 diabetes mellitus without complications: Secondary | ICD-10-CM | POA: Diagnosis not present

## 2021-12-02 DIAGNOSIS — E785 Hyperlipidemia, unspecified: Secondary | ICD-10-CM | POA: Diagnosis not present

## 2021-12-02 DIAGNOSIS — K219 Gastro-esophageal reflux disease without esophagitis: Secondary | ICD-10-CM | POA: Diagnosis not present

## 2021-12-02 DIAGNOSIS — I10 Essential (primary) hypertension: Secondary | ICD-10-CM | POA: Diagnosis not present

## 2021-12-31 DIAGNOSIS — E785 Hyperlipidemia, unspecified: Secondary | ICD-10-CM | POA: Diagnosis not present

## 2021-12-31 DIAGNOSIS — K219 Gastro-esophageal reflux disease without esophagitis: Secondary | ICD-10-CM | POA: Diagnosis not present

## 2021-12-31 DIAGNOSIS — E119 Type 2 diabetes mellitus without complications: Secondary | ICD-10-CM | POA: Diagnosis not present

## 2021-12-31 DIAGNOSIS — I10 Essential (primary) hypertension: Secondary | ICD-10-CM | POA: Diagnosis not present

## 2022-01-02 DIAGNOSIS — I10 Essential (primary) hypertension: Secondary | ICD-10-CM | POA: Diagnosis not present

## 2022-01-02 DIAGNOSIS — E119 Type 2 diabetes mellitus without complications: Secondary | ICD-10-CM | POA: Diagnosis not present

## 2022-01-22 ENCOUNTER — Ambulatory Visit: Payer: TRICARE For Life (TFL) | Admitting: Cardiology

## 2022-01-30 DIAGNOSIS — I10 Essential (primary) hypertension: Secondary | ICD-10-CM | POA: Diagnosis not present

## 2022-01-30 DIAGNOSIS — E119 Type 2 diabetes mellitus without complications: Secondary | ICD-10-CM | POA: Diagnosis not present

## 2022-01-31 DIAGNOSIS — R200 Anesthesia of skin: Secondary | ICD-10-CM | POA: Diagnosis not present

## 2022-01-31 DIAGNOSIS — M5417 Radiculopathy, lumbosacral region: Secondary | ICD-10-CM | POA: Diagnosis not present

## 2022-01-31 DIAGNOSIS — M47817 Spondylosis without myelopathy or radiculopathy, lumbosacral region: Secondary | ICD-10-CM | POA: Diagnosis not present

## 2022-02-01 DIAGNOSIS — I10 Essential (primary) hypertension: Secondary | ICD-10-CM | POA: Diagnosis not present

## 2022-02-01 DIAGNOSIS — E119 Type 2 diabetes mellitus without complications: Secondary | ICD-10-CM | POA: Diagnosis not present

## 2022-02-05 DIAGNOSIS — E1165 Type 2 diabetes mellitus with hyperglycemia: Secondary | ICD-10-CM | POA: Diagnosis not present

## 2022-02-05 DIAGNOSIS — Z299 Encounter for prophylactic measures, unspecified: Secondary | ICD-10-CM | POA: Diagnosis not present

## 2022-02-05 DIAGNOSIS — Z23 Encounter for immunization: Secondary | ICD-10-CM | POA: Diagnosis not present

## 2022-02-05 DIAGNOSIS — R5383 Other fatigue: Secondary | ICD-10-CM | POA: Diagnosis not present

## 2022-02-05 DIAGNOSIS — I10 Essential (primary) hypertension: Secondary | ICD-10-CM | POA: Diagnosis not present

## 2022-02-11 DIAGNOSIS — M48061 Spinal stenosis, lumbar region without neurogenic claudication: Secondary | ICD-10-CM | POA: Diagnosis not present

## 2022-02-11 DIAGNOSIS — M4726 Other spondylosis with radiculopathy, lumbar region: Secondary | ICD-10-CM | POA: Diagnosis not present

## 2022-02-11 DIAGNOSIS — M5136 Other intervertebral disc degeneration, lumbar region: Secondary | ICD-10-CM | POA: Diagnosis not present

## 2022-02-25 DIAGNOSIS — M5417 Radiculopathy, lumbosacral region: Secondary | ICD-10-CM | POA: Diagnosis not present

## 2022-02-25 DIAGNOSIS — R2681 Unsteadiness on feet: Secondary | ICD-10-CM | POA: Diagnosis not present

## 2022-02-25 DIAGNOSIS — R200 Anesthesia of skin: Secondary | ICD-10-CM | POA: Diagnosis not present

## 2022-02-25 DIAGNOSIS — M47817 Spondylosis without myelopathy or radiculopathy, lumbosacral region: Secondary | ICD-10-CM | POA: Diagnosis not present

## 2022-02-25 DIAGNOSIS — R296 Repeated falls: Secondary | ICD-10-CM | POA: Diagnosis not present

## 2022-03-01 DIAGNOSIS — E119 Type 2 diabetes mellitus without complications: Secondary | ICD-10-CM | POA: Diagnosis not present

## 2022-03-01 DIAGNOSIS — I10 Essential (primary) hypertension: Secondary | ICD-10-CM | POA: Diagnosis not present

## 2022-03-04 DIAGNOSIS — E119 Type 2 diabetes mellitus without complications: Secondary | ICD-10-CM | POA: Diagnosis not present

## 2022-03-04 DIAGNOSIS — I10 Essential (primary) hypertension: Secondary | ICD-10-CM | POA: Diagnosis not present

## 2022-03-17 DIAGNOSIS — Z9981 Dependence on supplemental oxygen: Secondary | ICD-10-CM | POA: Diagnosis not present

## 2022-03-17 DIAGNOSIS — R059 Cough, unspecified: Secondary | ICD-10-CM | POA: Diagnosis not present

## 2022-03-17 DIAGNOSIS — Z7984 Long term (current) use of oral hypoglycemic drugs: Secondary | ICD-10-CM | POA: Diagnosis not present

## 2022-03-17 DIAGNOSIS — Z1152 Encounter for screening for COVID-19: Secondary | ICD-10-CM | POA: Diagnosis not present

## 2022-03-17 DIAGNOSIS — Z79899 Other long term (current) drug therapy: Secondary | ICD-10-CM | POA: Diagnosis not present

## 2022-03-17 DIAGNOSIS — R778 Other specified abnormalities of plasma proteins: Secondary | ICD-10-CM | POA: Diagnosis not present

## 2022-03-17 DIAGNOSIS — R6520 Severe sepsis without septic shock: Secondary | ICD-10-CM | POA: Diagnosis not present

## 2022-03-17 DIAGNOSIS — E785 Hyperlipidemia, unspecified: Secondary | ICD-10-CM | POA: Diagnosis not present

## 2022-03-17 DIAGNOSIS — N3000 Acute cystitis without hematuria: Secondary | ICD-10-CM | POA: Diagnosis not present

## 2022-03-17 DIAGNOSIS — M109 Gout, unspecified: Secondary | ICD-10-CM | POA: Diagnosis not present

## 2022-03-17 DIAGNOSIS — Z743 Need for continuous supervision: Secondary | ICD-10-CM | POA: Diagnosis not present

## 2022-03-17 DIAGNOSIS — I2510 Atherosclerotic heart disease of native coronary artery without angina pectoris: Secondary | ICD-10-CM | POA: Diagnosis not present

## 2022-03-17 DIAGNOSIS — R7989 Other specified abnormal findings of blood chemistry: Secondary | ICD-10-CM | POA: Diagnosis not present

## 2022-03-17 DIAGNOSIS — R0902 Hypoxemia: Secondary | ICD-10-CM | POA: Diagnosis not present

## 2022-03-17 DIAGNOSIS — M1990 Unspecified osteoarthritis, unspecified site: Secondary | ICD-10-CM | POA: Diagnosis not present

## 2022-03-17 DIAGNOSIS — A419 Sepsis, unspecified organism: Secondary | ICD-10-CM | POA: Diagnosis not present

## 2022-03-17 DIAGNOSIS — R54 Age-related physical debility: Secondary | ICD-10-CM | POA: Diagnosis not present

## 2022-03-17 DIAGNOSIS — N3001 Acute cystitis with hematuria: Secondary | ICD-10-CM | POA: Diagnosis not present

## 2022-03-17 DIAGNOSIS — J9601 Acute respiratory failure with hypoxia: Secondary | ICD-10-CM | POA: Diagnosis not present

## 2022-03-17 DIAGNOSIS — E7800 Pure hypercholesterolemia, unspecified: Secondary | ICD-10-CM | POA: Diagnosis not present

## 2022-03-17 DIAGNOSIS — R6889 Other general symptoms and signs: Secondary | ICD-10-CM | POA: Diagnosis not present

## 2022-03-17 DIAGNOSIS — Z87891 Personal history of nicotine dependence: Secondary | ICD-10-CM | POA: Diagnosis not present

## 2022-03-17 DIAGNOSIS — Z792 Long term (current) use of antibiotics: Secondary | ICD-10-CM | POA: Diagnosis not present

## 2022-03-17 DIAGNOSIS — M159 Polyosteoarthritis, unspecified: Secondary | ICD-10-CM | POA: Diagnosis not present

## 2022-03-17 DIAGNOSIS — I499 Cardiac arrhythmia, unspecified: Secondary | ICD-10-CM | POA: Diagnosis not present

## 2022-03-17 DIAGNOSIS — K219 Gastro-esophageal reflux disease without esophagitis: Secondary | ICD-10-CM | POA: Diagnosis not present

## 2022-03-17 DIAGNOSIS — E119 Type 2 diabetes mellitus without complications: Secondary | ICD-10-CM | POA: Diagnosis not present

## 2022-03-17 DIAGNOSIS — R0689 Other abnormalities of breathing: Secondary | ICD-10-CM | POA: Diagnosis not present

## 2022-03-17 DIAGNOSIS — Z20822 Contact with and (suspected) exposure to covid-19: Secondary | ICD-10-CM | POA: Diagnosis not present

## 2022-03-17 DIAGNOSIS — Z7982 Long term (current) use of aspirin: Secondary | ICD-10-CM | POA: Diagnosis not present

## 2022-03-17 DIAGNOSIS — I5A Non-ischemic myocardial injury (non-traumatic): Secondary | ICD-10-CM | POA: Diagnosis not present

## 2022-03-17 DIAGNOSIS — I10 Essential (primary) hypertension: Secondary | ICD-10-CM | POA: Diagnosis not present

## 2022-03-17 DIAGNOSIS — J690 Pneumonitis due to inhalation of food and vomit: Secondary | ICD-10-CM | POA: Diagnosis not present

## 2022-03-18 DIAGNOSIS — E119 Type 2 diabetes mellitus without complications: Secondary | ICD-10-CM | POA: Diagnosis not present

## 2022-03-18 DIAGNOSIS — N3001 Acute cystitis with hematuria: Secondary | ICD-10-CM | POA: Diagnosis not present

## 2022-03-18 DIAGNOSIS — M1990 Unspecified osteoarthritis, unspecified site: Secondary | ICD-10-CM | POA: Diagnosis not present

## 2022-03-18 DIAGNOSIS — R778 Other specified abnormalities of plasma proteins: Secondary | ICD-10-CM | POA: Diagnosis not present

## 2022-03-18 DIAGNOSIS — I2510 Atherosclerotic heart disease of native coronary artery without angina pectoris: Secondary | ICD-10-CM | POA: Diagnosis not present

## 2022-03-18 DIAGNOSIS — Z792 Long term (current) use of antibiotics: Secondary | ICD-10-CM | POA: Diagnosis not present

## 2022-03-18 DIAGNOSIS — Z9981 Dependence on supplemental oxygen: Secondary | ICD-10-CM | POA: Diagnosis not present

## 2022-03-18 DIAGNOSIS — E785 Hyperlipidemia, unspecified: Secondary | ICD-10-CM | POA: Diagnosis not present

## 2022-03-18 DIAGNOSIS — Z79899 Other long term (current) drug therapy: Secondary | ICD-10-CM | POA: Diagnosis not present

## 2022-03-18 DIAGNOSIS — R54 Age-related physical debility: Secondary | ICD-10-CM | POA: Diagnosis not present

## 2022-03-18 DIAGNOSIS — I5A Non-ischemic myocardial injury (non-traumatic): Secondary | ICD-10-CM | POA: Diagnosis not present

## 2022-03-19 DIAGNOSIS — R0902 Hypoxemia: Secondary | ICD-10-CM | POA: Diagnosis not present

## 2022-03-19 DIAGNOSIS — N3001 Acute cystitis with hematuria: Secondary | ICD-10-CM | POA: Diagnosis not present

## 2022-03-19 DIAGNOSIS — M109 Gout, unspecified: Secondary | ICD-10-CM | POA: Diagnosis not present

## 2022-03-19 DIAGNOSIS — M159 Polyosteoarthritis, unspecified: Secondary | ICD-10-CM | POA: Diagnosis not present

## 2022-03-19 DIAGNOSIS — R7989 Other specified abnormal findings of blood chemistry: Secondary | ICD-10-CM | POA: Diagnosis not present

## 2022-03-19 DIAGNOSIS — I5A Non-ischemic myocardial injury (non-traumatic): Secondary | ICD-10-CM | POA: Diagnosis not present

## 2022-03-19 DIAGNOSIS — R54 Age-related physical debility: Secondary | ICD-10-CM | POA: Diagnosis not present

## 2022-03-19 DIAGNOSIS — R778 Other specified abnormalities of plasma proteins: Secondary | ICD-10-CM | POA: Diagnosis not present

## 2022-03-19 DIAGNOSIS — E119 Type 2 diabetes mellitus without complications: Secondary | ICD-10-CM | POA: Diagnosis not present

## 2022-03-19 DIAGNOSIS — E7800 Pure hypercholesterolemia, unspecified: Secondary | ICD-10-CM | POA: Diagnosis not present

## 2022-03-19 DIAGNOSIS — I2510 Atherosclerotic heart disease of native coronary artery without angina pectoris: Secondary | ICD-10-CM | POA: Diagnosis not present

## 2022-03-20 DIAGNOSIS — E119 Type 2 diabetes mellitus without complications: Secondary | ICD-10-CM | POA: Diagnosis not present

## 2022-03-20 DIAGNOSIS — I10 Essential (primary) hypertension: Secondary | ICD-10-CM | POA: Diagnosis not present

## 2022-03-25 DIAGNOSIS — Z79891 Long term (current) use of opiate analgesic: Secondary | ICD-10-CM | POA: Diagnosis not present

## 2022-03-25 DIAGNOSIS — M109 Gout, unspecified: Secondary | ICD-10-CM | POA: Diagnosis not present

## 2022-03-25 DIAGNOSIS — E1169 Type 2 diabetes mellitus with other specified complication: Secondary | ICD-10-CM | POA: Diagnosis not present

## 2022-03-25 DIAGNOSIS — R0902 Hypoxemia: Secondary | ICD-10-CM | POA: Diagnosis not present

## 2022-03-25 DIAGNOSIS — Z7984 Long term (current) use of oral hypoglycemic drugs: Secondary | ICD-10-CM | POA: Diagnosis not present

## 2022-03-25 DIAGNOSIS — E7800 Pure hypercholesterolemia, unspecified: Secondary | ICD-10-CM | POA: Diagnosis not present

## 2022-03-25 DIAGNOSIS — K219 Gastro-esophageal reflux disease without esophagitis: Secondary | ICD-10-CM | POA: Diagnosis not present

## 2022-03-25 DIAGNOSIS — I2510 Atherosclerotic heart disease of native coronary artery without angina pectoris: Secondary | ICD-10-CM | POA: Diagnosis not present

## 2022-03-25 DIAGNOSIS — I5A Non-ischemic myocardial injury (non-traumatic): Secondary | ICD-10-CM | POA: Diagnosis not present

## 2022-03-25 DIAGNOSIS — M159 Polyosteoarthritis, unspecified: Secondary | ICD-10-CM | POA: Diagnosis not present

## 2022-03-25 DIAGNOSIS — N3001 Acute cystitis with hematuria: Secondary | ICD-10-CM | POA: Diagnosis not present

## 2022-03-26 DIAGNOSIS — I25118 Atherosclerotic heart disease of native coronary artery with other forms of angina pectoris: Secondary | ICD-10-CM | POA: Diagnosis not present

## 2022-03-26 DIAGNOSIS — I10 Essential (primary) hypertension: Secondary | ICD-10-CM | POA: Diagnosis not present

## 2022-03-26 DIAGNOSIS — Z09 Encounter for follow-up examination after completed treatment for conditions other than malignant neoplasm: Secondary | ICD-10-CM | POA: Diagnosis not present

## 2022-03-26 DIAGNOSIS — Z299 Encounter for prophylactic measures, unspecified: Secondary | ICD-10-CM | POA: Diagnosis not present

## 2022-03-31 DIAGNOSIS — M109 Gout, unspecified: Secondary | ICD-10-CM | POA: Diagnosis not present

## 2022-03-31 DIAGNOSIS — E7800 Pure hypercholesterolemia, unspecified: Secondary | ICD-10-CM | POA: Diagnosis not present

## 2022-03-31 DIAGNOSIS — Z79891 Long term (current) use of opiate analgesic: Secondary | ICD-10-CM | POA: Diagnosis not present

## 2022-03-31 DIAGNOSIS — E1169 Type 2 diabetes mellitus with other specified complication: Secondary | ICD-10-CM | POA: Diagnosis not present

## 2022-03-31 DIAGNOSIS — M159 Polyosteoarthritis, unspecified: Secondary | ICD-10-CM | POA: Diagnosis not present

## 2022-03-31 DIAGNOSIS — Z7984 Long term (current) use of oral hypoglycemic drugs: Secondary | ICD-10-CM | POA: Diagnosis not present

## 2022-03-31 DIAGNOSIS — I5A Non-ischemic myocardial injury (non-traumatic): Secondary | ICD-10-CM | POA: Diagnosis not present

## 2022-03-31 DIAGNOSIS — I10 Essential (primary) hypertension: Secondary | ICD-10-CM | POA: Diagnosis not present

## 2022-03-31 DIAGNOSIS — K219 Gastro-esophageal reflux disease without esophagitis: Secondary | ICD-10-CM | POA: Diagnosis not present

## 2022-03-31 DIAGNOSIS — R0902 Hypoxemia: Secondary | ICD-10-CM | POA: Diagnosis not present

## 2022-03-31 DIAGNOSIS — E119 Type 2 diabetes mellitus without complications: Secondary | ICD-10-CM | POA: Diagnosis not present

## 2022-03-31 DIAGNOSIS — N3001 Acute cystitis with hematuria: Secondary | ICD-10-CM | POA: Diagnosis not present

## 2022-03-31 DIAGNOSIS — I2510 Atherosclerotic heart disease of native coronary artery without angina pectoris: Secondary | ICD-10-CM | POA: Diagnosis not present

## 2022-04-01 DIAGNOSIS — R0602 Shortness of breath: Secondary | ICD-10-CM | POA: Diagnosis not present

## 2022-04-02 DIAGNOSIS — I2510 Atherosclerotic heart disease of native coronary artery without angina pectoris: Secondary | ICD-10-CM | POA: Diagnosis not present

## 2022-04-02 DIAGNOSIS — Z79891 Long term (current) use of opiate analgesic: Secondary | ICD-10-CM | POA: Diagnosis not present

## 2022-04-02 DIAGNOSIS — E1169 Type 2 diabetes mellitus with other specified complication: Secondary | ICD-10-CM | POA: Diagnosis not present

## 2022-04-02 DIAGNOSIS — M159 Polyosteoarthritis, unspecified: Secondary | ICD-10-CM | POA: Diagnosis not present

## 2022-04-02 DIAGNOSIS — Z7984 Long term (current) use of oral hypoglycemic drugs: Secondary | ICD-10-CM | POA: Diagnosis not present

## 2022-04-02 DIAGNOSIS — R0902 Hypoxemia: Secondary | ICD-10-CM | POA: Diagnosis not present

## 2022-04-02 DIAGNOSIS — K219 Gastro-esophageal reflux disease without esophagitis: Secondary | ICD-10-CM | POA: Diagnosis not present

## 2022-04-02 DIAGNOSIS — I5A Non-ischemic myocardial injury (non-traumatic): Secondary | ICD-10-CM | POA: Diagnosis not present

## 2022-04-02 DIAGNOSIS — N3001 Acute cystitis with hematuria: Secondary | ICD-10-CM | POA: Diagnosis not present

## 2022-04-02 DIAGNOSIS — M109 Gout, unspecified: Secondary | ICD-10-CM | POA: Diagnosis not present

## 2022-04-02 DIAGNOSIS — E7800 Pure hypercholesterolemia, unspecified: Secondary | ICD-10-CM | POA: Diagnosis not present

## 2022-04-03 DIAGNOSIS — R0902 Hypoxemia: Secondary | ICD-10-CM | POA: Diagnosis not present

## 2022-04-03 DIAGNOSIS — I10 Essential (primary) hypertension: Secondary | ICD-10-CM | POA: Diagnosis not present

## 2022-04-03 DIAGNOSIS — Z79891 Long term (current) use of opiate analgesic: Secondary | ICD-10-CM | POA: Diagnosis not present

## 2022-04-03 DIAGNOSIS — E119 Type 2 diabetes mellitus without complications: Secondary | ICD-10-CM | POA: Diagnosis not present

## 2022-04-03 DIAGNOSIS — I5A Non-ischemic myocardial injury (non-traumatic): Secondary | ICD-10-CM | POA: Diagnosis not present

## 2022-04-03 DIAGNOSIS — E7800 Pure hypercholesterolemia, unspecified: Secondary | ICD-10-CM | POA: Diagnosis not present

## 2022-04-03 DIAGNOSIS — M159 Polyosteoarthritis, unspecified: Secondary | ICD-10-CM | POA: Diagnosis not present

## 2022-04-03 DIAGNOSIS — N3001 Acute cystitis with hematuria: Secondary | ICD-10-CM | POA: Diagnosis not present

## 2022-04-03 DIAGNOSIS — I2510 Atherosclerotic heart disease of native coronary artery without angina pectoris: Secondary | ICD-10-CM | POA: Diagnosis not present

## 2022-04-03 DIAGNOSIS — E1169 Type 2 diabetes mellitus with other specified complication: Secondary | ICD-10-CM | POA: Diagnosis not present

## 2022-04-03 DIAGNOSIS — M109 Gout, unspecified: Secondary | ICD-10-CM | POA: Diagnosis not present

## 2022-04-03 DIAGNOSIS — K219 Gastro-esophageal reflux disease without esophagitis: Secondary | ICD-10-CM | POA: Diagnosis not present

## 2022-04-03 DIAGNOSIS — Z7984 Long term (current) use of oral hypoglycemic drugs: Secondary | ICD-10-CM | POA: Diagnosis not present

## 2022-04-07 DIAGNOSIS — R0902 Hypoxemia: Secondary | ICD-10-CM | POA: Diagnosis not present

## 2022-04-07 DIAGNOSIS — I2510 Atherosclerotic heart disease of native coronary artery without angina pectoris: Secondary | ICD-10-CM | POA: Diagnosis not present

## 2022-04-07 DIAGNOSIS — M109 Gout, unspecified: Secondary | ICD-10-CM | POA: Diagnosis not present

## 2022-04-07 DIAGNOSIS — I5A Non-ischemic myocardial injury (non-traumatic): Secondary | ICD-10-CM | POA: Diagnosis not present

## 2022-04-07 DIAGNOSIS — K219 Gastro-esophageal reflux disease without esophagitis: Secondary | ICD-10-CM | POA: Diagnosis not present

## 2022-04-07 DIAGNOSIS — E1169 Type 2 diabetes mellitus with other specified complication: Secondary | ICD-10-CM | POA: Diagnosis not present

## 2022-04-07 DIAGNOSIS — E7800 Pure hypercholesterolemia, unspecified: Secondary | ICD-10-CM | POA: Diagnosis not present

## 2022-04-07 DIAGNOSIS — Z7984 Long term (current) use of oral hypoglycemic drugs: Secondary | ICD-10-CM | POA: Diagnosis not present

## 2022-04-07 DIAGNOSIS — Z79891 Long term (current) use of opiate analgesic: Secondary | ICD-10-CM | POA: Diagnosis not present

## 2022-04-07 DIAGNOSIS — N3001 Acute cystitis with hematuria: Secondary | ICD-10-CM | POA: Diagnosis not present

## 2022-04-07 DIAGNOSIS — M159 Polyosteoarthritis, unspecified: Secondary | ICD-10-CM | POA: Diagnosis not present

## 2022-04-08 DIAGNOSIS — I2510 Atherosclerotic heart disease of native coronary artery without angina pectoris: Secondary | ICD-10-CM | POA: Diagnosis not present

## 2022-04-08 DIAGNOSIS — Z79891 Long term (current) use of opiate analgesic: Secondary | ICD-10-CM | POA: Diagnosis not present

## 2022-04-08 DIAGNOSIS — R0902 Hypoxemia: Secondary | ICD-10-CM | POA: Diagnosis not present

## 2022-04-08 DIAGNOSIS — M159 Polyosteoarthritis, unspecified: Secondary | ICD-10-CM | POA: Diagnosis not present

## 2022-04-08 DIAGNOSIS — E7800 Pure hypercholesterolemia, unspecified: Secondary | ICD-10-CM | POA: Diagnosis not present

## 2022-04-08 DIAGNOSIS — N3001 Acute cystitis with hematuria: Secondary | ICD-10-CM | POA: Diagnosis not present

## 2022-04-08 DIAGNOSIS — E1169 Type 2 diabetes mellitus with other specified complication: Secondary | ICD-10-CM | POA: Diagnosis not present

## 2022-04-08 DIAGNOSIS — Z7984 Long term (current) use of oral hypoglycemic drugs: Secondary | ICD-10-CM | POA: Diagnosis not present

## 2022-04-08 DIAGNOSIS — M109 Gout, unspecified: Secondary | ICD-10-CM | POA: Diagnosis not present

## 2022-04-08 DIAGNOSIS — I5A Non-ischemic myocardial injury (non-traumatic): Secondary | ICD-10-CM | POA: Diagnosis not present

## 2022-04-08 DIAGNOSIS — K219 Gastro-esophageal reflux disease without esophagitis: Secondary | ICD-10-CM | POA: Diagnosis not present

## 2022-04-10 DIAGNOSIS — Z7189 Other specified counseling: Secondary | ICD-10-CM | POA: Diagnosis not present

## 2022-04-10 DIAGNOSIS — I10 Essential (primary) hypertension: Secondary | ICD-10-CM | POA: Diagnosis not present

## 2022-04-10 DIAGNOSIS — E7800 Pure hypercholesterolemia, unspecified: Secondary | ICD-10-CM | POA: Diagnosis not present

## 2022-04-10 DIAGNOSIS — Z789 Other specified health status: Secondary | ICD-10-CM | POA: Diagnosis not present

## 2022-04-10 DIAGNOSIS — Z1211 Encounter for screening for malignant neoplasm of colon: Secondary | ICD-10-CM | POA: Diagnosis not present

## 2022-04-10 DIAGNOSIS — Z299 Encounter for prophylactic measures, unspecified: Secondary | ICD-10-CM | POA: Diagnosis not present

## 2022-04-10 DIAGNOSIS — Z0000 Encounter for general adult medical examination without abnormal findings: Secondary | ICD-10-CM | POA: Diagnosis not present

## 2022-04-10 DIAGNOSIS — E1165 Type 2 diabetes mellitus with hyperglycemia: Secondary | ICD-10-CM | POA: Diagnosis not present

## 2022-04-11 DIAGNOSIS — N3001 Acute cystitis with hematuria: Secondary | ICD-10-CM | POA: Diagnosis not present

## 2022-04-11 DIAGNOSIS — Z7984 Long term (current) use of oral hypoglycemic drugs: Secondary | ICD-10-CM | POA: Diagnosis not present

## 2022-04-11 DIAGNOSIS — M159 Polyosteoarthritis, unspecified: Secondary | ICD-10-CM | POA: Diagnosis not present

## 2022-04-11 DIAGNOSIS — K219 Gastro-esophageal reflux disease without esophagitis: Secondary | ICD-10-CM | POA: Diagnosis not present

## 2022-04-11 DIAGNOSIS — Z79891 Long term (current) use of opiate analgesic: Secondary | ICD-10-CM | POA: Diagnosis not present

## 2022-04-11 DIAGNOSIS — E7800 Pure hypercholesterolemia, unspecified: Secondary | ICD-10-CM | POA: Diagnosis not present

## 2022-04-11 DIAGNOSIS — I2510 Atherosclerotic heart disease of native coronary artery without angina pectoris: Secondary | ICD-10-CM | POA: Diagnosis not present

## 2022-04-11 DIAGNOSIS — M109 Gout, unspecified: Secondary | ICD-10-CM | POA: Diagnosis not present

## 2022-04-11 DIAGNOSIS — E1169 Type 2 diabetes mellitus with other specified complication: Secondary | ICD-10-CM | POA: Diagnosis not present

## 2022-04-11 DIAGNOSIS — I5A Non-ischemic myocardial injury (non-traumatic): Secondary | ICD-10-CM | POA: Diagnosis not present

## 2022-04-11 DIAGNOSIS — R0902 Hypoxemia: Secondary | ICD-10-CM | POA: Diagnosis not present

## 2022-04-30 DIAGNOSIS — I10 Essential (primary) hypertension: Secondary | ICD-10-CM | POA: Diagnosis not present

## 2022-04-30 DIAGNOSIS — E119 Type 2 diabetes mellitus without complications: Secondary | ICD-10-CM | POA: Diagnosis not present

## 2022-05-04 DIAGNOSIS — E119 Type 2 diabetes mellitus without complications: Secondary | ICD-10-CM | POA: Diagnosis not present

## 2022-05-04 DIAGNOSIS — I10 Essential (primary) hypertension: Secondary | ICD-10-CM | POA: Diagnosis not present

## 2022-05-30 DIAGNOSIS — I10 Essential (primary) hypertension: Secondary | ICD-10-CM | POA: Diagnosis not present

## 2022-05-30 DIAGNOSIS — E119 Type 2 diabetes mellitus without complications: Secondary | ICD-10-CM | POA: Diagnosis not present

## 2022-06-04 DIAGNOSIS — I10 Essential (primary) hypertension: Secondary | ICD-10-CM | POA: Diagnosis not present

## 2022-06-04 DIAGNOSIS — E119 Type 2 diabetes mellitus without complications: Secondary | ICD-10-CM | POA: Diagnosis not present

## 2022-06-18 DIAGNOSIS — I10 Essential (primary) hypertension: Secondary | ICD-10-CM | POA: Diagnosis not present

## 2022-06-18 DIAGNOSIS — R600 Localized edema: Secondary | ICD-10-CM | POA: Diagnosis not present

## 2022-06-18 DIAGNOSIS — Z299 Encounter for prophylactic measures, unspecified: Secondary | ICD-10-CM | POA: Diagnosis not present

## 2022-06-18 DIAGNOSIS — E1165 Type 2 diabetes mellitus with hyperglycemia: Secondary | ICD-10-CM | POA: Diagnosis not present

## 2022-06-29 DIAGNOSIS — I10 Essential (primary) hypertension: Secondary | ICD-10-CM | POA: Diagnosis not present

## 2022-06-29 DIAGNOSIS — E119 Type 2 diabetes mellitus without complications: Secondary | ICD-10-CM | POA: Diagnosis not present

## 2022-06-30 DIAGNOSIS — M48062 Spinal stenosis, lumbar region with neurogenic claudication: Secondary | ICD-10-CM | POA: Diagnosis not present

## 2022-06-30 DIAGNOSIS — M48061 Spinal stenosis, lumbar region without neurogenic claudication: Secondary | ICD-10-CM | POA: Diagnosis not present

## 2022-06-30 DIAGNOSIS — M4726 Other spondylosis with radiculopathy, lumbar region: Secondary | ICD-10-CM | POA: Diagnosis not present

## 2022-06-30 DIAGNOSIS — G894 Chronic pain syndrome: Secondary | ICD-10-CM | POA: Diagnosis not present

## 2022-06-30 DIAGNOSIS — M5136 Other intervertebral disc degeneration, lumbar region: Secondary | ICD-10-CM | POA: Diagnosis not present

## 2022-06-30 DIAGNOSIS — M5459 Other low back pain: Secondary | ICD-10-CM | POA: Diagnosis not present

## 2022-06-30 DIAGNOSIS — M1612 Unilateral primary osteoarthritis, left hip: Secondary | ICD-10-CM | POA: Diagnosis not present

## 2022-07-04 ENCOUNTER — Ambulatory Visit: Payer: TRICARE For Life (TFL) | Admitting: Cardiology

## 2022-07-04 NOTE — Progress Notes (Deleted)
    Cardiology Office Note  Date: 07/04/2022   ID: Wesley Garrison, DOB 08-Jun-1924, MRN [id number]  History of Present Illness: Wesley Garrison is a 89 y.o. male last seen in November 2022.  Physical Exam: VS:  There were no vitals taken for this visit., BMI There is no height or weight on file to calculate BMI.  Wt Readings from Last 3 Encounters:  03/13/21 168 lb 6.4 oz (76.4 kg)  10/05/19 169 lb 4.8 oz (76.8 kg)  08/12/19 176 lb (79.8 kg)    General: Patient appears comfortable at rest. HEENT: Conjunctiva and lids normal, oropharynx clear with moist mucosa. Neck: Supple, no elevated JVP or carotid bruits, no thyromegaly. Lungs: Clear to auscultation, nonlabored breathing at rest. Cardiac: Regular rate and rhythm, no S3 or significant systolic murmur, no pericardial rub. Abdomen: Soft, nontender, no hepatomegaly, bowel sounds present, no guarding or rebound. Extremities: No pitting edema, distal pulses 2+. Skin: Warm and dry. Musculoskeletal: No kyphosis. Neuropsychiatric: Alert and oriented x3, affect grossly appropriate.  ECG:  An ECG dated 03/13/2021 was personally reviewed today and demonstrated:  Sinus bradycardia with prolonged PR interval and nonspecific ST-T changes.  Labwork:  November 2022: Cholesterol 115, triglycerides 76, HDL 43, LDL 57 November 2023: Hemoglobin 11.8, platelets 174, potassium 3.9, BUN 29, creatinine 1.03  Other Studies Reviewed Today:  No interval cardiac testing for review today.  Assessment and Plan:  1.  Ischemic heart disease based on Lexiscan Myoview from 2014 showing mild apical/anteroseptal ischemia.  He has been managed medically over time.  2.  Mixed hyperlipidemia.  3.  Essential hypertension.  Disposition:  Follow up {follow up:15908}  Signed, Satira Sark, M.D., F.A.C.C.

## 2022-07-07 DIAGNOSIS — R600 Localized edema: Secondary | ICD-10-CM | POA: Diagnosis not present

## 2022-07-17 DIAGNOSIS — R58 Hemorrhage, not elsewhere classified: Secondary | ICD-10-CM | POA: Diagnosis not present

## 2022-08-15 ENCOUNTER — Telehealth: Payer: Self-pay | Admitting: Nurse Practitioner

## 2022-08-15 ENCOUNTER — Encounter: Payer: Self-pay | Admitting: Nurse Practitioner

## 2022-08-15 ENCOUNTER — Ambulatory Visit: Payer: Medicare Other | Attending: Nurse Practitioner | Admitting: Nurse Practitioner

## 2022-08-15 VITALS — BP 130/90 | HR 75 | Wt 165.6 lb

## 2022-08-15 DIAGNOSIS — M7989 Other specified soft tissue disorders: Secondary | ICD-10-CM | POA: Diagnosis not present

## 2022-08-15 DIAGNOSIS — Z79899 Other long term (current) drug therapy: Secondary | ICD-10-CM | POA: Diagnosis not present

## 2022-08-15 DIAGNOSIS — R410 Disorientation, unspecified: Secondary | ICD-10-CM | POA: Diagnosis not present

## 2022-08-15 DIAGNOSIS — I10 Essential (primary) hypertension: Secondary | ICD-10-CM | POA: Diagnosis not present

## 2022-08-15 DIAGNOSIS — R2990 Unspecified symptoms and signs involving the nervous system: Secondary | ICD-10-CM

## 2022-08-15 DIAGNOSIS — I4891 Unspecified atrial fibrillation: Secondary | ICD-10-CM | POA: Diagnosis not present

## 2022-08-15 DIAGNOSIS — I259 Chronic ischemic heart disease, unspecified: Secondary | ICD-10-CM

## 2022-08-15 DIAGNOSIS — R296 Repeated falls: Secondary | ICD-10-CM

## 2022-08-15 NOTE — Progress Notes (Signed)
Office Visit    Patient Name: JAECEON SMETHURST Date of Encounter: 08/15/2022  PCP:  Ignatius Specking, MD   [facility]  Cardiologist:  Nona Dell, MD  Advanced Practice Provider:  No care team member to display Electrophysiologist:  None 6}  Chief Complaint    ALFONSE ROBE is a 89 y.o. male with a hx of ischemic heart disease, mitral regurgitation, type 2 diabetes, hypertension, GERD, and history of kidney stones, who presents today for regular follow-up.    Past Medical History    Past Medical History:  Diagnosis Date   Arthritis    DM type 2 (diabetes mellitus, type 2)    Essential hypertension    GERD (gastroesophageal reflux disease)    Gout    History of kidney stones    HOH (hard of hearing)    Ischemic heart disease    Mitral regurgitation    Past Surgical History:  Procedure Laterality Date   ANKLE SURGERY Right 1961   removal of ankle bone   CATARACT EXTRACTION W/PHACO  03/11/2012   Procedure: CATARACT EXTRACTION PHACO AND INTRAOCULAR LENS PLACEMENT ([facility]);  Surgeon: Gemma Payor, MD;  Location: [facility];  Service: Ophthalmology;  Laterality: Left;  CDE 11.09   CATARACT EXTRACTION W/PHACO Right 11/02/2019   Procedure: CATARACT EXTRACTION PHACO AND INTRAOCULAR LENS PLACEMENT RIGHT EYE;  Surgeon: Fabio Pierce, MD;  Location: [facility];  Service: Ophthalmology;  Laterality: Right;  CDE: 26.18   COLONOSCOPY     COLONOSCOPY N/A 03/01/2013   Procedure: COLONOSCOPY;  Surgeon: Malissa Hippo, MD;  Location: [facility];  Service: Endoscopy;  Laterality: N/A;  730   ESOPHAGOGASTRODUODENOSCOPY N/A 09/10/2017   Procedure: ESOPHAGOGASTRODUODENOSCOPY (EGD);  Surgeon: Malissa Hippo, MD;  [facility]: [facility];  Service: Endoscopy;  Laterality: N/A;  245   EYE SURGERY     JOINT REPLACEMENT Bilateral    RIght hip 12-2013 and bilateral [id number]   spinal cyst  1946   TOTAL HIP ARTHROPLASTY Right 12/22/2013   Procedure: RIGHT TOTAL HIP ARTHROPLASTY  ANTERIOR APPROACH;  Surgeon: Loanne Drilling, MD;  Location: [facility];  Service: Orthopedics;  Laterality: Right;    Allergies  No Known Allergies  History of Present Illness    SHIRON SPRINGER is a 89 y.o. male with a PMH as mentioned above.  Previous patient of Dr. Purvis Sheffield.  Last seen by Dr. Diona Browner on 03/13/2021.  At that time, he had some falls without any fractures or serious injuries.  He was overall doing well from a cardiac perspective.  Today he presents for regular follow-up. Patient is a difficult historian during this visit. He had slow, delayed speech during interview at times and signs of expressive aphasia with other evidence of difficulty with word finding/forming sentence, CMA Desma Paganini also agreed. He appeared confused with the questions I was asking him. When I asked him how he was doing initially he said, "not good." Then told me he is "falling all of the time." In the last 6 months, he has fallen 10 times. Attributes this to his knees, denies any fractures. Said he had pneumonia around a month ago. Denies any chest pain, shortness of breath, palpitations, syncope, presyncope, dizziness, orthopnea, PND, significant weight changes, acute bleeding, or claudication. Admits to leg swelling, R>L chronically. EKG today reveals new onset A-fib.   EKGs/Labs/Other Studies Reviewed:   The following studies were reviewed today:  EKG:  EKG is ordered today.  The ekg ordered  today demonstrates A-fib, 76 bpm, nonspecific ST/T wave abnormality, otherwise nothing acute.   Recent Labs: No results found for requested labs within last 365 days.  Recent Lipid Panel No results found for: "CHOL", "TRIG", "HDL", "CHOLHDL", "VLDL", "LDLCALC", "LDLDIRECT"  Risk Assessment/Calculations:   CHA2DS2-VASc Score = 4   This indicates a 4.8% annual risk of stroke. The patient's score is based upon: CHF History: 0 HTN History: 1 Diabetes History: 1 Stroke History: 0 Vascular Disease  History: 0 Age Score: 2 Gender Score: 0  Home Medications   Current Meds  Medication Sig   amLODipine (NORVASC) 5 MG tablet Take 5 mg by mouth daily.   aspirin EC 81 MG tablet Take 162 mg by mouth daily.    atorvastatin (LIPITOR) 20 MG tablet Take 20 mg by mouth daily.    fluticasone (FLONASE) 50 MCG/ACT nasal spray Place 2 sprays into both nostrils daily as needed for allergies.    GLIPIZIDE XL 5 MG 24 hr tablet Take 5-10 mg by mouth See admin instructions. Take 10 mg by mouth in the morning and take 5 mg by mouth in the evening   JANUVIA 100 MG tablet Take 100 mg by mouth daily.    lisinopril (PRINIVIL,ZESTRIL) 20 MG tablet Take 20 mg by mouth 2 (two) times daily.   metoprolol tartrate (LOPRESSOR) 50 MG tablet Take 50 mg by mouth 2 (two) times daily.   oxyCODONE (OXY IR/ROXICODONE) 5 MG immediate release tablet Take 5 mg by mouth 3 (three) times daily as needed.   pantoprazole (PROTONIX) 40 MG tablet Take 40 mg by mouth every morning.   Tamsulosin HCl (FLOMAX) 0.4 MG CAPS Take 0.4 mg by mouth 2 (two) times daily.      Review of Systems    All other systems reviewed and are otherwise negative except as noted above.  Physical Exam    VS:  BP (!) 130/90 (BP Location: Left Arm, Patient Position: Sitting, Cuff Size: Normal)   Pulse 75   Wt 165 lb 9.6 oz (75.1 kg)   SpO2 96%   BMI 24.45 kg/m  , BMI Body mass index is 24.45 kg/m.  Wt Readings from Last 3 Encounters:  08/15/22 165 lb 9.6 oz (75.1 kg)  03/13/21 168 lb 6.4 oz (76.4 kg)  10/05/19 169 lb 4.8 oz (76.8 kg)     GEN: Thin, 89 y.o. male in no acute distress. HEENT: normal. Neck: Supple, no JVD, carotid bruits, or masses. Cardiac: S1/S2, irregular rhythm and regular rate, no murmurs, rubs, or gallops. No clubbing, cyanosis. Nonpitting, dependent edema R>L. Negative homan's sign. Radials2+/PT 1+ and equal bilaterally.  Respiratory:  Respirations regular and unlabored, clear to auscultation bilaterally. MS: No deformity  or atrophy, generalized weakness, difficulty raising from sitting position to standing position, walks with cane. Skin: Warm and dry, no rash, thin skin. Neuro:  Strength and sensation are intact. Some confusion in speech, episodes of slow/delayed speech, expressive aphasia, difficulty with word finding or forming sentence.  Psych: Normal affect.  Assessment & Plan    Stroke-like symptoms, confusion?  This was my chief concern during visit today. No facial droop, no unilateral weakness/paralysis. Some confusion in speech, episodes of slow/delayed speech, expressive aphasia, difficulty with word finding or forming sentence during interview today. Patient presented alone for visit initially. Wife was in waiting room and brought back to room during visit. Wife says husband is at baseline, does not appear different per her report. Discussed my concern for a stroke/possibility of past stroke, wife  stated not concerned, stated "he is not understanding how you talk." Also spoke on phone regarding my concern about stroke after office visit, patient and wife not concerned and were agreeable to see Dr. Sherril Croon (PCP) for evaluation for first available appointment next week. Risks of delayed care discussed. ED precautions discussed regarding FAST acronym. No signs of abuse/neglect noted on exam.  New onset A-fib Newly diagnosed on EKG today. HR well controlled. Spoke with patient and wife regarding diagnosis and increased risk of stroke. Currently not an anticoagulation candidate with history of frequent falls and not a surgical candidate for history of confusion - see above.  Will route note to Dr. Diona Browner so he is aware. Will bring patient back with wife present in 2 weeks after evaluation by PCP to discuss other treatment options as time did not permit further discussion during office visit today.   Ischemic heart disease Stable with no anginal symptoms. No indication for ischemic evaluation. Continue current  meds. Heart healthy diet encouraged.   HTN BP stable. No medication changes at this time. Heart healthy diet encouraged.   Leg edema Nonpitting, dependent edema R>L - chronic per his report. Negative homan's sign. Radials2+/PT 1+ and equal bilaterally. Will arrange doppler to evaluate for DVT and arrange the following labs: BMET, proBNP, and Mag level. Said he had Echo done by PCP recently - will request records.   Frequent falls Attributes this to his knees. Not an anticoagulation candidate. Continue to follow with PCP - recommend pt would benefit from PT/ortho referral. ED precautions discussed.   Disposition: Follow up in 2 week(s) with Nona Dell, MD or APP.  Signed, Sharlene Dory, NP 08/15/2022, 5:46 PM [facility]

## 2022-08-15 NOTE — Patient Instructions (Signed)
Medication Instructions:  Your physician recommends that you continue on your current medications as directed. Please refer to the Current Medication list given to you today.   Labwork: BMET, MAG, BNP due in 1-2 weeks @ [facility]  Testing/Procedures: Doppler of the lower extremity  Follow-Up:  Your physician recommends that you schedule a follow-up appointment in: 6 months  Any Other Special Instructions Will Be Listed Below (If Applicable).  If you need a refill on your cardiac medications before your next appointment, please call your pharmacy.

## 2022-08-15 NOTE — Telephone Encounter (Signed)
Called and s/w patient and patient's wife (okay per DPR) regarding my chief concern regarding stroke-like symptoms and discussed new diagnosis of A-fib confirmed by EKG. Recommended ED evaluation, however wife and patient decline, and wife states that he is at his baseline, does not appear to be having new symptoms per her report. Recommended to see PCP for first available appt and they are agreeable. Will fax note over to Dr. Bonnita Levan office for evaluation ASAP.   Will bring patient back in 2 weeks for evaluation for A-fib and discuss further with them. Education and history taking challenging with patient and wife during this scheduled office visit d/t patient's confusion and my chief concern of patient's stroke like symptoms as mentioned above (wife was not concerned about) (also see my progress note for today). Would strongly recommend wife is present for entirety of all office visits with patient. HR well controlled and pt is not an appropriate candidate for anticoagulation (due to frequent falls) or surgical candidate (due to confusion). No signs of abuse or neglect on exam.   Sharlene Dory, NP

## 2022-08-18 ENCOUNTER — Telehealth: Payer: Self-pay

## 2022-08-18 NOTE — Telephone Encounter (Signed)
Called and spoke with patient regarding setting up appointment with Sharlene Dory in 2 weeks to further discuss A-fib. Patient states that he did not want to schedule a visit because he does not feel as if he is all that ill. Informed patient that I will make Lanora Manis aware of his decision. Verbalized understanding.

## 2022-08-18 NOTE — Progress Notes (Signed)
Office note faxed to PCP

## 2022-08-18 NOTE — Telephone Encounter (Signed)
-----   Message from Sharlene Dory, NP sent at 08/15/2022  5:50 PM EDT ----- Please schedule office visit with me or APP in 2 weeks to discuss more about A-fib. My chief concern during this office visit was his stroke-like symptoms, his confusion made history taking difficult. I have appts available for afternoon on 08/28/22 - would make him a last appt patient (takes some time). Please fax my note over to Dr. Bonnita Levan office and tell him I am concerned about his stroke - like symptoms. Recommended ED eval to wife and patient - declined because he "is at his baseline." And wanted to see Dr. Sherril Croon - want him to be seen for first available appt.   Thanks!   Best,  Sharlene Dory, NP

## 2022-08-27 DIAGNOSIS — R600 Localized edema: Secondary | ICD-10-CM | POA: Diagnosis not present

## 2022-08-27 DIAGNOSIS — I10 Essential (primary) hypertension: Secondary | ICD-10-CM | POA: Diagnosis not present

## 2022-08-27 DIAGNOSIS — Z299 Encounter for prophylactic measures, unspecified: Secondary | ICD-10-CM | POA: Diagnosis not present

## 2022-08-27 DIAGNOSIS — M179 Osteoarthritis of knee, unspecified: Secondary | ICD-10-CM | POA: Diagnosis not present

## 2022-09-01 ENCOUNTER — Ambulatory Visit: Payer: Medicare Other | Admitting: Nurse Practitioner

## 2022-09-23 DIAGNOSIS — I10 Essential (primary) hypertension: Secondary | ICD-10-CM | POA: Diagnosis not present

## 2022-09-23 DIAGNOSIS — M159 Polyosteoarthritis, unspecified: Secondary | ICD-10-CM | POA: Diagnosis not present

## 2022-09-23 DIAGNOSIS — Z299 Encounter for prophylactic measures, unspecified: Secondary | ICD-10-CM | POA: Diagnosis not present

## 2022-09-23 DIAGNOSIS — E1165 Type 2 diabetes mellitus with hyperglycemia: Secondary | ICD-10-CM | POA: Diagnosis not present

## 2022-09-24 ENCOUNTER — Encounter: Payer: Self-pay | Admitting: *Deleted

## 2022-10-27 DIAGNOSIS — W06XXXA Fall from bed, initial encounter: Secondary | ICD-10-CM | POA: Diagnosis not present

## 2022-10-27 DIAGNOSIS — R0902 Hypoxemia: Secondary | ICD-10-CM | POA: Diagnosis not present

## 2022-10-27 DIAGNOSIS — D72829 Elevated white blood cell count, unspecified: Secondary | ICD-10-CM | POA: Diagnosis not present

## 2022-10-27 DIAGNOSIS — Z87891 Personal history of nicotine dependence: Secondary | ICD-10-CM | POA: Diagnosis not present

## 2022-10-27 DIAGNOSIS — J90 Pleural effusion, not elsewhere classified: Secondary | ICD-10-CM | POA: Diagnosis not present

## 2022-10-27 DIAGNOSIS — Z7982 Long term (current) use of aspirin: Secondary | ICD-10-CM | POA: Diagnosis not present

## 2022-10-27 DIAGNOSIS — M47812 Spondylosis without myelopathy or radiculopathy, cervical region: Secondary | ICD-10-CM | POA: Diagnosis not present

## 2022-10-27 DIAGNOSIS — H538 Other visual disturbances: Secondary | ICD-10-CM | POA: Diagnosis not present

## 2022-10-27 DIAGNOSIS — Z7984 Long term (current) use of oral hypoglycemic drugs: Secondary | ICD-10-CM | POA: Diagnosis not present

## 2022-10-27 DIAGNOSIS — D649 Anemia, unspecified: Secondary | ICD-10-CM | POA: Diagnosis not present

## 2022-10-27 DIAGNOSIS — Z743 Need for continuous supervision: Secondary | ICD-10-CM | POA: Diagnosis not present

## 2022-10-27 DIAGNOSIS — Z515 Encounter for palliative care: Secondary | ICD-10-CM | POA: Diagnosis not present

## 2022-10-27 DIAGNOSIS — G935 Compression of brain: Secondary | ICD-10-CM | POA: Diagnosis not present

## 2022-10-27 DIAGNOSIS — J9602 Acute respiratory failure with hypercapnia: Secondary | ICD-10-CM | POA: Diagnosis not present

## 2022-10-27 DIAGNOSIS — E876 Hypokalemia: Secondary | ICD-10-CM | POA: Diagnosis not present

## 2022-10-27 DIAGNOSIS — Z66 Do not resuscitate: Secondary | ICD-10-CM | POA: Diagnosis not present

## 2022-10-27 DIAGNOSIS — Z4682 Encounter for fitting and adjustment of non-vascular catheter: Secondary | ICD-10-CM | POA: Diagnosis not present

## 2022-10-27 DIAGNOSIS — Z79899 Other long term (current) drug therapy: Secondary | ICD-10-CM | POA: Diagnosis not present

## 2022-10-27 DIAGNOSIS — S06A0XA Traumatic brain compression without herniation, initial encounter: Secondary | ICD-10-CM | POA: Diagnosis not present

## 2022-10-27 DIAGNOSIS — I700 Atherosclerosis of aorta: Secondary | ICD-10-CM | POA: Diagnosis not present

## 2022-10-27 DIAGNOSIS — J9601 Acute respiratory failure with hypoxia: Secondary | ICD-10-CM | POA: Diagnosis not present

## 2022-10-27 DIAGNOSIS — D696 Thrombocytopenia, unspecified: Secondary | ICD-10-CM | POA: Diagnosis not present

## 2022-10-27 DIAGNOSIS — R52 Pain, unspecified: Secondary | ICD-10-CM | POA: Diagnosis not present

## 2022-10-27 DIAGNOSIS — R402433 Glasgow coma scale score 3-8, at hospital admission: Secondary | ICD-10-CM | POA: Diagnosis not present

## 2022-10-27 DIAGNOSIS — M4802 Spinal stenosis, cervical region: Secondary | ICD-10-CM | POA: Diagnosis not present

## 2022-10-27 DIAGNOSIS — R1310 Dysphagia, unspecified: Secondary | ICD-10-CM | POA: Diagnosis not present

## 2022-10-27 DIAGNOSIS — R404 Transient alteration of awareness: Secondary | ICD-10-CM | POA: Diagnosis not present

## 2022-10-27 DIAGNOSIS — S065X0A Traumatic subdural hemorrhage without loss of consciousness, initial encounter: Secondary | ICD-10-CM | POA: Diagnosis not present

## 2022-10-27 DIAGNOSIS — J984 Other disorders of lung: Secondary | ICD-10-CM | POA: Diagnosis not present

## 2022-10-27 DIAGNOSIS — E119 Type 2 diabetes mellitus without complications: Secondary | ICD-10-CM | POA: Diagnosis not present

## 2022-10-27 DIAGNOSIS — I6201 Nontraumatic acute subdural hemorrhage: Secondary | ICD-10-CM | POA: Diagnosis not present

## 2022-10-27 DIAGNOSIS — Z043 Encounter for examination and observation following other accident: Secondary | ICD-10-CM | POA: Diagnosis not present

## 2022-10-27 DIAGNOSIS — R918 Other nonspecific abnormal finding of lung field: Secondary | ICD-10-CM | POA: Diagnosis not present

## 2022-10-27 DIAGNOSIS — I10 Essential (primary) hypertension: Secondary | ICD-10-CM | POA: Diagnosis not present

## 2022-10-27 DIAGNOSIS — R6889 Other general symptoms and signs: Secondary | ICD-10-CM | POA: Diagnosis not present

## 2022-10-27 DIAGNOSIS — S065XAA Traumatic subdural hemorrhage with loss of consciousness status unknown, initial encounter: Secondary | ICD-10-CM | POA: Diagnosis not present

## 2022-10-27 DIAGNOSIS — I499 Cardiac arrhythmia, unspecified: Secondary | ICD-10-CM | POA: Diagnosis not present

## 2022-10-27 DIAGNOSIS — R110 Nausea: Secondary | ICD-10-CM | POA: Diagnosis not present

## 2022-10-27 DIAGNOSIS — G8191 Hemiplegia, unspecified affecting right dominant side: Secondary | ICD-10-CM | POA: Diagnosis not present

## 2022-10-27 DIAGNOSIS — I6200 Nontraumatic subdural hemorrhage, unspecified: Secondary | ICD-10-CM | POA: Diagnosis not present

## 2022-10-28 DIAGNOSIS — R52 Pain, unspecified: Secondary | ICD-10-CM | POA: Diagnosis not present

## 2022-10-28 DIAGNOSIS — R110 Nausea: Secondary | ICD-10-CM | POA: Diagnosis not present

## 2022-10-28 DIAGNOSIS — I10 Essential (primary) hypertension: Secondary | ICD-10-CM | POA: Diagnosis not present

## 2022-10-28 DIAGNOSIS — D72829 Elevated white blood cell count, unspecified: Secondary | ICD-10-CM | POA: Diagnosis not present

## 2022-10-28 DIAGNOSIS — G935 Compression of brain: Secondary | ICD-10-CM | POA: Diagnosis not present

## 2022-10-28 DIAGNOSIS — Z515 Encounter for palliative care: Secondary | ICD-10-CM | POA: Diagnosis not present

## 2022-10-28 DIAGNOSIS — S065XAA Traumatic subdural hemorrhage with loss of consciousness status unknown, initial encounter: Secondary | ICD-10-CM | POA: Diagnosis not present

## 2022-10-28 DIAGNOSIS — J9601 Acute respiratory failure with hypoxia: Secondary | ICD-10-CM | POA: Diagnosis not present

## 2022-11-03 DEATH — deceased

## 2023-02-19 ENCOUNTER — Ambulatory Visit: Payer: TRICARE For Life (TFL) | Admitting: Cardiology
